# Patient Record
Sex: Female | Born: 1985 | Race: White | Hispanic: Yes | Marital: Married | Smoking: Never smoker
Health system: Southern US, Community
[De-identification: ages and names within clinical notes are randomized; demographics above are authoritative.]

## PROBLEM LIST (undated history)

## (undated) DIAGNOSIS — K219 Gastro-esophageal reflux disease without esophagitis: Secondary | ICD-10-CM

---
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
## 2017-06-17 ENCOUNTER — Ambulatory Visit: Payer: BC Managed Care – PPO | Admitting: Obstetrics and Gynecology

## 2017-06-17 VITALS — Ht 64.0 in | Wt 281.9 lb

## 2017-06-17 DIAGNOSIS — Z3A09 9 weeks gestation of pregnancy: Secondary | ICD-10-CM

## 2017-06-17 NOTE — Progress Notes (Signed)
Diana CollinsMaria N Mahoney presents for NOB nurse interview visit. [facility].  G- 2.  P-1 . Pregnancy education material explained and given. __No_ cats in the home. NOB labs ordered. (TSH/HbgA1c due to Increased BMI), . HIV labs and Drug screen were explained optional and  Drug screen ordered. PNV encouraged. Genetic screening options discussed. Genetic testing: unsure will discuss with provider.  Pt. To follow up with provider in _3_ weeks for NOB physical.  All questions answered.

## 2017-06-17 NOTE — Patient Instructions (Signed)
First Trimester of Pregnancy The first trimester of pregnancy is from week 1 until the end of week 13 (months 1 through 3). During this time, your baby will begin to develop inside you. At 6-8 weeks, the eyes and face are formed, and the heartbeat can be seen on ultrasound. At the end of 12 weeks, all the baby's organs are formed. Prenatal care is all the medical care you receive before the birth of your baby. Make sure you get good prenatal care and follow all of your doctor's instructions. Follow these instructions at home: Medicines  Take over-the-counter and prescription medicines only as told by your doctor. Some medicines are safe and some medicines are not safe during pregnancy.  Take a prenatal vitamin that contains at least 600 micrograms (mcg) of folic acid.  If you have trouble pooping (constipation), take medicine that will make your stool soft (stool softener) if your doctor approves. Eating and drinking  Eat regular, healthy meals.  Your doctor will tell you the amount of weight gain that is right for you.  Avoid raw meat and uncooked cheese.  If you feel sick to your stomach (nauseous) or throw up (vomit): ? Eat 4 or 5 small meals a day instead of 3 large meals. ? Try eating a few soda crackers. ? Drink liquids between meals instead of during meals.  To prevent constipation: ? Eat foods that are high in fiber, like fresh fruits and vegetables, whole grains, and beans. ? Drink enough fluids to keep your pee (urine) clear or pale yellow. Activity  Exercise only as told by your doctor. Stop exercising if you have cramps or pain in your lower belly (abdomen) or low back.  Do not exercise if it is too hot, too humid, or if you are in a place of great height (high altitude).  Try to avoid standing for long periods of time. Move your legs often if you must stand in one place for a long time.  Avoid heavy lifting.  Wear low-heeled shoes. Sit and stand up straight.  You  can have sex unless your doctor tells you not to. Relieving pain and discomfort  Wear a good support bra if your breasts are sore.  Take warm water baths (sitz baths) to soothe pain or discomfort caused by hemorrhoids. Use hemorrhoid cream if your doctor says it is okay.  Rest with your legs raised if you have leg cramps or low back pain.  If you have puffy, bulging veins (varicose veins) in your legs: ? Wear support hose or compression stockings as told by your doctor. ? Raise (elevate) your feet for 15 minutes, 3-4 times a day. ? Limit salt in your food. Prenatal care  Schedule your prenatal visits by the twelfth week of pregnancy.  Write down your questions. Take them to your prenatal visits.  Keep all your prenatal visits as told by your doctor. This is important. Safety  Wear your seat belt at all times when driving.  Make a list of emergency phone numbers. The list should include numbers for family, friends, the hospital, and police and fire departments. General instructions  Ask your doctor for a referral to a local prenatal class. Begin classes no later than at the start of month 6 of your pregnancy.  Ask for help if you need counseling or if you need help with nutrition. Your doctor can give you advice or tell you where to go for help.  Do not use hot tubs, steam rooms, or   saunas.  Do not douche or use tampons or scented sanitary pads.  Do not cross your legs for long periods of time.  Avoid all herbs and alcohol. Avoid drugs that are not approved by your doctor.  Do not use any tobacco products, including cigarettes, chewing tobacco, and electronic cigarettes. If you need help quitting, ask your doctor. You may get counseling or other support to help you quit.  Avoid cat litter boxes and soil used by cats. These carry germs that can cause birth defects in the baby and can cause a loss of your baby (miscarriage) or stillbirth.  Visit your dentist. At home, brush  your teeth with a soft toothbrush. Be gentle when you floss. Contact a doctor if:  You are dizzy.  You have mild cramps or pressure in your lower belly.  You have a nagging pain in your belly area.  You continue to feel sick to your stomach, you throw up, or you have watery poop (diarrhea).  You have a bad smelling fluid coming from your vagina.  You have pain when you pee (urinate).  You have increased puffiness (swelling) in your face, hands, legs, or ankles. Get help right away if:  You have a fever.  You are leaking fluid from your vagina.  You have spotting or bleeding from your vagina.  You have very bad belly cramping or pain.  You gain or lose weight rapidly.  You throw up blood. It may look like coffee grounds.  You are around people who have German measles, fifth disease, or chickenpox.  You have a very bad headache.  You have shortness of breath.  You have any kind of trauma, such as from a fall or a car accident. Summary  The first trimester of pregnancy is from week 1 until the end of week 13 (months 1 through 3).  To take care of yourself and your unborn baby, you will need to eat healthy meals, take medicines only if your doctor tells you to do so, and do activities that are safe for you and your baby.  Keep all follow-up visits as told by your doctor. This is important as your doctor will have to ensure that your baby is healthy and growing well. This information is not intended to replace advice given to you by your health care provider. Make sure you discuss any questions you have with your health care provider. Document Released: 01/06/2008 Document Revised: 07/28/2016 Document Reviewed: 07/28/2016 Elsevier Interactive Patient Education  2017 [employer]  

## 2017-06-18 LAB — ABO AND RH: Rh Factor: POSITIVE

## 2017-06-18 LAB — MONITOR DRUG PROFILE 14(MW)
Amphetamine Scrn, Ur: NEGATIVE ng/mL
BARBITURATE SCREEN URINE: NEGATIVE ng/mL
BENZODIAZEPINE SCREEN, URINE: NEGATIVE ng/mL
BUPRENORPHINE, URINE: NEGATIVE ng/mL
CANNABINOIDS UR QL SCN: NEGATIVE ng/mL
CREATININE(CRT), U: 197.6 mg/dL (ref 20.0–300.0)
Cocaine (Metab) Scrn, Ur: NEGATIVE ng/mL
Fentanyl, Urine: NEGATIVE pg/mL
MEPERIDINE SCREEN, URINE: NEGATIVE ng/mL
METHADONE SCREEN, URINE: NEGATIVE ng/mL
OXYCODONE+OXYMORPHONE UR QL SCN: NEGATIVE ng/mL
Opiate Scrn, Ur: NEGATIVE ng/mL
PH UR, DRUG SCRN: 5.7 (ref 4.5–8.9)
PHENCYCLIDINE QUANTITATIVE URINE: NEGATIVE ng/mL
Propoxyphene Scrn, Ur: NEGATIVE ng/mL
SPECIFIC GRAVITY: 1.022
Tramadol Screen, Urine: NEGATIVE ng/mL

## 2017-06-18 LAB — CBC WITH DIFFERENTIAL/PLATELET
BASOS: 0 %
Basophils Absolute: 0 10*3/uL (ref 0.0–0.2)
EOS (ABSOLUTE): 0.3 10*3/uL (ref 0.0–0.4)
EOS: 3 %
HEMATOCRIT: 36.9 % (ref 34.0–46.6)
Hemoglobin: 12 g/dL (ref 11.1–15.9)
IMMATURE GRANS (ABS): 0 10*3/uL (ref 0.0–0.1)
IMMATURE GRANULOCYTES: 0 %
LYMPHS: 30 %
Lymphocytes Absolute: 3.1 10*3/uL (ref 0.7–3.1)
MCH: 28.6 pg (ref 26.6–33.0)
MCHC: 32.5 g/dL (ref 31.5–35.7)
MCV: 88 fL (ref 79–97)
MONOS ABS: 0.6 10*3/uL (ref 0.1–0.9)
Monocytes: 6 %
Neutrophils Absolute: 6.5 10*3/uL (ref 1.4–7.0)
Neutrophils: 61 %
PLATELETS: 276 10*3/uL (ref 150–379)
RBC: 4.19 x10E6/uL (ref 3.77–5.28)
RDW: 13.8 % (ref 12.3–15.4)
WBC: 10.5 10*3/uL (ref 3.4–10.8)

## 2017-06-18 LAB — HIV ANTIBODY (ROUTINE TESTING W REFLEX): HIV SCREEN 4TH GENERATION: NONREACTIVE

## 2017-06-18 LAB — MICROSCOPIC EXAMINATION: CASTS: NONE SEEN /LPF

## 2017-06-18 LAB — HEMOGLOBIN A1C
Est. average glucose Bld gHb Est-mCnc: 111 mg/dL
HEMOGLOBIN A1C: 5.5 % (ref 4.8–5.6)

## 2017-06-18 LAB — URINALYSIS, ROUTINE W REFLEX MICROSCOPIC
Bilirubin, UA: NEGATIVE
Glucose, UA: NEGATIVE
Ketones, UA: NEGATIVE
NITRITE UA: NEGATIVE
RBC, UA: NEGATIVE
Specific Gravity, UA: 1.029 (ref 1.005–1.030)
Urobilinogen, Ur: 0.2 mg/dL (ref 0.2–1.0)
pH, UA: 6 (ref 5.0–7.5)

## 2017-06-18 LAB — GC/CHLAMYDIA PROBE AMP
Chlamydia trachomatis, NAA: NEGATIVE
NEISSERIA GONORRHOEAE BY PCR: NEGATIVE

## 2017-06-18 LAB — RUBELLA SCREEN: RUBELLA: 2.2 {index} (ref >0.99)

## 2017-06-18 LAB — HEPATITIS B SURFACE ANTIGEN: HEP B S AG: NEGATIVE

## 2017-06-18 LAB — ANTIBODY SCREEN: Antibody Screen: NEGATIVE

## 2017-06-18 LAB — VARICELLA ZOSTER ANTIBODY, IGG: Varicella zoster IgG: 802 index (ref >165)

## 2017-06-18 LAB — RPR: RPR: NONREACTIVE

## 2017-06-18 LAB — TSH: TSH: 2.1 u[IU]/mL (ref 0.450–4.500)

## 2017-06-19 LAB — URINE CULTURE

## 2017-07-09 ENCOUNTER — Ambulatory Visit (INDEPENDENT_AMBULATORY_CARE_PROVIDER_SITE_OTHER): Payer: BC Managed Care – PPO | Admitting: Certified Nurse Midwife

## 2017-07-09 ENCOUNTER — Encounter: Payer: Self-pay | Admitting: Certified Nurse Midwife

## 2017-07-09 VITALS — BP 110/75 | HR 81 | Wt 282.0 lb

## 2017-07-09 DIAGNOSIS — Z6841 Body Mass Index (BMI) 40.0 and over, adult: Secondary | ICD-10-CM

## 2017-07-09 DIAGNOSIS — Z3482 Encounter for supervision of other normal pregnancy, second trimester: Secondary | ICD-10-CM | POA: Diagnosis not present

## 2017-07-09 LAB — POCT URINALYSIS DIPSTICK
Bilirubin, UA: NEGATIVE
Glucose, UA: NEGATIVE
Ketones, UA: NEGATIVE
NITRITE UA: NEGATIVE
PH UA: 6 (ref 5.0–8.0)
RBC UA: NEGATIVE
Spec Grav, UA: >=1.03 — AB (ref 1.010–1.025)
UROBILINOGEN UA: 0.2 U/dL

## 2017-07-09 NOTE — Progress Notes (Signed)
NOB PE- last pap 06/2016 neg.

## 2017-07-09 NOTE — Progress Notes (Signed)
NEW OB HISTORY AND PHYSICAL  SUBJECTIVE:       Diana Mahoney is a 31 y.o. G2P1 female, Patient's last menstrual period was 04/11/2017., Estimated Date of Delivery: 01/16/18, [id number], presents today for establishment of Prenatal Care.  She has no unusual complaints. Endorses nausea without vomiting, scent aversion, and breast tenderness.   Denies difficulty breathing or respiratory distress, chest pain, abdominal pain, vaginal bleeding, dysuria, and leg pain or swelling.   Gynecologic History  Patient's last menstrual period was 04/11/2017.   Contraception: none  Last Pap: 06/08/2016. Results were: normal  Obstetric History  OB History  Gravida Para Term Preterm AB Living  2 1          SAB TAB Ectopic Multiple Live Births               # Outcome Date GA Lbr Len/2nd Weight Sex Delivery Anes PTL Lv  2 Current           1 Para 2007    M CS-Unspec         History reviewed. No pertinent past medical history.  Past Surgical History:  Procedure Laterality Date  . CESAREAN SECTION      Current Outpatient Medications on File Prior to Visit  Medication Sig Dispense Refill  . Prenatal Vit-Fe Fumarate-FA (PRENATAL MULTIVITAMIN) TABS tablet Take 1 tablet daily at 12 noon by mouth.     No current facility-administered medications on file prior to visit.     No Known Allergies  Social History   Socioeconomic History  . Marital status: Married    Spouse name: Not on file  . Number of children: Not on file  . Years of education: Not on file  . Highest education level: Not on file  Social Needs  . Financial resource strain: Not on file  . Food insecurity - worry: Not on file  . Food insecurity - inability: Not on file  . Transportation needs - medical: Not on file  . Transportation needs - non-medical: Not on file  Occupational History  . Not on file  Tobacco Use  . Smoking status: Never Smoker  . Smokeless tobacco: Never Used  Substance and Sexual Activity  . Alcohol  use: No    Frequency: Never  . Drug use: No  . Sexual activity: Yes    Partners: Male  Other Topics Concern  . Not on file  Social History Narrative  . Not on file    History reviewed. No pertinent family history.  The following portions of the patient's history were reviewed and updated as appropriate: allergies, current medications, past OB history, past medical history, past surgical history, past family history, past social history, and problem list.  OBJECTIVE:  BP 110/75   Pulse 81   Wt 282 lb (127.9 kg)   LMP 04/11/2017   BMI 48.41 kg/m   Initial Physical Exam (New OB)  GENERAL APPEARANCE: alert, well appearing, in no apparent distress  HEAD: normocephalic, atraumatic  MOUTH: mucous membranes moist, pharynx normal without lesions and dental hygiene good  THYROID: no thyromegaly or masses present  BREASTS: no masses noted, no significant tenderness, no palpable axillary nodes, no skin changes  LUNGS: clear to auscultation, no wheezes, rales or rhonchi, symmetric air entry  HEART: regular rate and rhythm, no murmurs  ABDOMEN: soft, nontender, nondistended, no abnormal masses, no epigastric pain, obese, fundus not palpable and FHT present  EXTREMITIES: no redness or tenderness in the calves or thighs, no edema  SKIN: normal coloration and turgor, no rashes  LYMPH NODES: no adenopathy palpable  NEUROLOGIC: alert, oriented, normal speech, no focal findings or movement disorder noted  PELVIC EXAM: not indicated  ASSESSMENT: Normal pregnancy Previous cesarean section, desires repeat Declines genetic screening BMI>45  PLAN: Prenatal care New OB counseling: The patient has been given an overview regarding routine prenatal care. Recommendations regarding diet, weight gain, and exercise in pregnancy were given. Prenatal testing, optional genetic testing, and ultrasound use in pregnancy were reviewed.  Benefits of Breast Feeding were discussed. The patient  is encouraged to consider nursing her baby post partum. See orders   Gunnar BullaJenkins Michelle Vergene Marland, CNM [facility], [id number]

## 2017-07-09 NOTE — Patient Instructions (Signed)
Eating Plan for Pregnant Women While you are pregnant, your body will require additional nutrition to help support your growing baby. It is recommended that you consume:  150 additional calories each day during your first trimester.  300 additional calories each day during your second trimester.  300 additional calories each day during your third trimester.  Eating a healthy, well-balanced diet is very important for your health and for your baby's health. You also have a higher need for some vitamins and minerals, such as folic acid, calcium, iron, and vitamin D. What do I need to know about eating during pregnancy?  Do not try to lose weight or go on a diet during pregnancy.  Choose healthy, nutritious foods. Choose  of a sandwich with a glass of milk instead of a candy bar or a high-calorie sugar-sweetened beverage.  Limit your overall intake of foods that have "empty calories." These are foods that have little nutritional value, such as sweets, desserts, candies, sugar-sweetened beverages, and fried foods.  Eat a variety of foods, especially fruits and vegetables.  Take a prenatal vitamin to help meet the additional needs during pregnancy, specifically for folic acid, iron, calcium, and vitamin D.  Remember to stay active. Ask your health care provider for exercise recommendations that are specific to you.  Practice good food safety and cleanliness, such as washing your hands before you eat and after you prepare raw meat. This helps to prevent foodborne illnesses, such as listeriosis, that can be very dangerous for your baby. Ask your health care provider for more information about listeriosis. What does 150 extra calories look like? Healthy options for an additional 150 calories each day could be any of the following:  Plain low-fat yogurt (6-8 oz) with  cup of berries.  1 apple with 2 teaspoons of peanut butter.  Cut-up vegetables with  cup of hummus.  Low-fat chocolate milk  (8 oz or 1 cup).  1 string cheese with 1 medium orange.   of a peanut butter and jelly sandwich on whole-wheat bread (1 tsp of peanut butter).  For 300 calories, you could eat two of those healthy options each day. What is a healthy amount of weight to gain? The recommended amount of weight for you to gain is based on your pre-pregnancy BMI. If your pre-pregnancy BMI was:  Less than 18 (underweight), you should gain 28-40 lb.  18-24.9 (normal), you should gain 25-35 lb.  25-29.9 (overweight), you should gain 15-25 lb.  Greater than 30 (obese), you should gain 11-20 lb.  What if I am having twins or multiples? Generally, pregnant women who will be having twins or multiples may need to increase their daily calories by 300-600 calories each day. The recommended range for total weight gain is 25-54 lb, depending on your pre-pregnancy BMI. Talk with your health care provider for specific guidance about additional nutritional needs, weight gain, and exercise during your pregnancy. What foods can I eat? Grains Any grains. Try to choose whole grains, such as whole-wheat bread, oatmeal, or brown rice. Vegetables Any vegetables. Try to eat a variety of colors and types of vegetables to get a full range of vitamins and minerals. Remember to wash your vegetables well before eating. Fruits Any fruits. Try to eat a variety of colors and types of fruit to get a full range of vitamins and minerals. Remember to wash your fruits well before eating. Meats and Other Protein Sources Lean meats, including chicken, [place], fish, and lean cuts of beef, veal,  or pork. Make sure that all meats are cooked to "well done." Tofu. Tempeh. Beans. Eggs. Peanut butter and other nut butters. Seafood, such as shrimp, crab, and lobster. If you choose fish, select types that are higher in omega-3 fatty acids, including salmon, herring, mussels, trout, sardines, and pollock. Make sure that all meats are cooked to food-safe  temperatures. Dairy Pasteurized milk and milk alternatives. Pasteurized yogurt and pasteurized cheese. Cottage cheese. Sour cream. Beverages Water. Juices that contain 100% fruit juice or vegetable juice. Caffeine-free teas and decaffeinated coffee. Drinks that contain caffeine are okay to drink, but it is better to avoid caffeine. Keep your total caffeine intake to less than 200 mg each day (12 oz of coffee, tea, or soda) or as directed by your health care provider. Condiments Any pasteurized condiments. Sweets and Desserts Any sweets and desserts. Fats and Oils Any fats and oils. The items listed above may not be a complete list of recommended foods or beverages. Contact your dietitian for more options. What foods are not recommended? Vegetables Unpasteurized (raw) vegetable juices. Fruits Unpasteurized (raw) fruit juices. Meats and Other Protein Sources Cured meats that have nitrates, such as bacon, salami, and hotdogs. Luncheon meats, bologna, or other deli meats (unless they are reheated until they are steaming hot). Refrigerated pate, meat spreads from a meat counter, smoked seafood that is found in the refrigerated section of a store. Raw fish, such as sushi or sashimi. High mercury content fish, such as tilefish, shark, swordfish, and king mackerel. Raw meats, such as tuna or beef tartare. Undercooked meats and poultry. Make sure that all meats are cooked to food-safe temperatures. Dairy Unpasteurized (raw) milk and any foods that have raw milk in them. Soft cheeses, such as feta, queso blanco, queso fresco, Brie, Camembert cheeses, blue-veined cheeses, and Panela cheese (unless it is made with pasteurized milk, which must be stated on the label). Beverages Alcohol. Sugar-sweetened beverages, such as sodas, teas, or energy drinks. Condiments Homemade fermented foods and drinks, such as pickles, sauerkraut, or kombucha drinks. (Store-bought pasteurized versions of these are  okay.) Other Salads that are made in the store, such as ham salad, chicken salad, egg salad, tuna salad, and seafood salad. The items listed above may not be a complete list of foods and beverages to avoid. Contact your dietitian for more information. This information is not intended to replace advice given to you by your health care provider. Make sure you discuss any questions you have with your health care provider. Document Released: 05/04/2014 Document Revised: 12/26/2015 Document Reviewed: 01/02/2014 Elsevier Interactive Patient Education  2018 [employer]. Back Pain in Pregnancy Back pain during pregnancy is common. Back pain may be caused by several factors that are related to changes during your pregnancy. Follow these instructions at home: Managing pain, stiffness, and swelling  If directed, apply ice for sudden (acute) back pain. ? Put ice in a plastic bag. ? Place a towel between your skin and the bag. ? Leave the ice on for 20 minutes, 2-3 times per day.  If directed, apply heat to the affected area before you exercise: ? Place a towel between your skin and the heat pack or heating pad. ? Leave the heat on for 20-30 minutes. ? Remove the heat if your skin turns bright red. This is especially important if you are unable to feel pain, heat, or cold. You may have a greater risk of getting burned. Activity  Exercise as told by your health care provider. Exercising is  the best way to prevent or manage back pain.  Listen to your body when lifting. If lifting hurts, ask for help or bend your knees. This uses your leg muscles instead of your back muscles.  Squat down when picking up something from the floor. Do not bend over.  Only use bed rest as told by your health care provider. Bed rest should only be used for the most severe episodes of back pain. Standing, Sitting, and Lying Down  Do not stand in one place for long periods of time.  Use good posture when sitting. Make  sure your head rests over your shoulders and is not hanging forward. Use a pillow on your lower back if necessary.  Try sleeping on your side, preferably the left side, with a pillow or two between your legs. If you are sore after a night's rest, your bed may be too soft. A firm mattress may provide more support for your back during pregnancy. General instructions  Do not wear high heels.  Eat a healthy diet. Try to gain weight within your health care provider's recommendations.  Use a maternity girdle, elastic sling, or back brace as told by your health care provider.  Take over-the-counter and prescription medicines only as told by your health care provider.  Keep all follow-up visits as told by your health care provider. This is important. This includes any visits with any specialists, such as a physical therapist. Contact a health care provider if:  Your back pain interferes with your daily activities.  You have increasing pain in other parts of your body. Get help right away if:  You develop numbness, tingling, weakness, or problems with the use of your arms or legs.  You develop severe back pain that is not controlled with medicine.  You have a sudden change in bowel or bladder control.  You develop shortness of breath, dizziness, or you faint.  You develop nausea, vomiting, or sweating.  You have back pain that is a rhythmic, cramping pain similar to labor pains. Labor pain is usually 1-2 minutes apart, lasts for about 1 minute, and involves a bearing down feeling or pressure in your pelvis.  You have back pain and your water breaks or you have vaginal bleeding.  You have back pain or numbness that travels down your leg.  Your back pain developed after you fell.  You develop pain on one side of your back.  You see blood in your urine.  You develop skin blisters in the area of your back pain. This information is not intended to replace advice given to you by your  health care provider. Make sure you discuss any questions you have with your health care provider. Document Released: 10/28/2005 Document Revised: 12/26/2015 Document Reviewed: 04/03/2015 Elsevier Interactive Patient Education  2018 [employer]. Round Ligament Pain The round ligament is a cord of muscle and tissue that helps to support the uterus. It can become a source of pain during pregnancy if it becomes stretched or twisted as the baby grows. The pain usually begins in the second trimester of pregnancy, and it can come and go until the baby is delivered. It is not a serious problem, and it does not cause harm to the baby. Round ligament pain is usually a short, sharp, and pinching pain, but it can also be a dull, lingering, and aching pain. The pain is felt in the lower side of the abdomen or in the groin. It usually starts deep in the groin  and moves up to the outside of the hip area. Pain can occur with:  A sudden change in position.  Rolling over in bed.  Coughing or sneezing.  Physical activity.  Follow these instructions at home: Watch your condition for any changes. Take these steps to help with your pain:  When the pain starts, relax. Then try: ? Sitting down. ? Flexing your knees up to your abdomen. ? Lying on your side with one pillow under your abdomen and another pillow between your legs. ? Sitting in a warm bath for 15-20 minutes or until the pain goes away.  Take over-the-counter and prescription medicines only as told by your health care provider.  Move slowly when you sit and stand.  Avoid long walks if they cause pain.  Stop or lessen your physical activities if they cause pain.  Contact a health care provider if:  Your pain does not go away with treatment.  You feel pain in your back that you did not have before.  Your medicine is not helping. Get help right away if:  You develop a fever or chills.  You develop uterine contractions.  You develop  vaginal bleeding.  You develop nausea or vomiting.  You develop diarrhea.  You have pain when you urinate. This information is not intended to replace advice given to you by your health care provider. Make sure you discuss any questions you have with your health care provider. Document Released: 04/28/2008 Document Revised: 12/26/2015 Document Reviewed: 09/26/2014 Elsevier Interactive Patient Education  2018 [employer]. Common Medications Safe in Pregnancy  Acne:      Constipation:  Benzoyl Peroxide     Colace  Clindamycin      Dulcolax Suppository  Topica Erythromycin     Fibercon  Salicylic Acid      Metamucil         Miralax AVOID:        Senakot   Accutane    Cough:  Retin-A       Cough Drops  Tetracycline      Phenergan w/ Codeine if Rx  Minocycline      Robitussin (Plain & DM)  Antibiotics:     Crabs/Lice:  Ceclor       RID  Cephalosporins    AVOID:  E-Mycins      Kwell  Keflex  Macrobid/Macrodantin   Diarrhea:  Penicillin      Kao-Pectate  Zithromax      Imodium AD         PUSH FLUIDS AVOID:       Cipro     Fever:  Tetracycline      Tylenol (Regular or Extra  Minocycline       Strength)  Levaquin      Extra Strength-Do not          Exceed 8 tabs/24 hrs Caffeine:        <275m/day (equiv. To 1 cup of coffee or  approx. 3 12 oz sodas)         Gas: Cold/Hayfever:       Gas-X  Benadryl      Mylicon  Claritin       Phazyme  **Claritin-D        Chlor-Trimeton    Headaches:  Dimetapp      ASA-Free Excedrin  Drixoral-Non-Drowsy     Cold Compress  Mucinex (Guaifenasin)     Tylenol (Regular or Extra  Sudafed/Sudafed-12 Hour     Strength)  **Sudafed PE Pseudoephedrine  Tylenol Cold & Sinus     Vicks Vapor Rub  Zyrtec  **AVOID if Problems With Blood Pressure         Heartburn: Avoid lying down for at least 1 hour after meals  Aciphex      Maalox     Rash:  Milk of Magnesia     Benadryl    Mylanta       1% Hydrocortisone Cream  Pepcid  Pepcid  Complete   Sleep Aids:  Prevacid      Ambien   Prilosec       Benadryl  Rolaids       Chamomile Tea  Tums (Limit 4/day)     Unisom  Zantac       Tylenol PM         Warm milk-add vanilla or  Hemorrhoids:       Sugar for taste  Anusol/Anusol H.C.  (RX: Analapram 2.5%)  Sugar Substitutes:  Hydrocortisone OTC     Ok in moderation  Preparation H      Tucks        Vaseline lotion applied to tissue with wiping    Herpes:     Throat:  Acyclovir      Oragel  Famvir  Valtrex     Vaccines:         Flu Shot Leg Cramps:       *Gardasil  Benadryl      Hepatitis A         Hepatitis B Nasal Spray:       Pneumovax  Saline Nasal Spray     Polio Booster         Tetanus Nausea:       Tuberculosis test or PPD  Vitamin B6 25 mg TID   AVOID:    Dramamine      *Gardasil  Emetrol       Live Poliovirus  Ginger Root 250 mg QID    MMR (measles, mumps &  High Complex Carbs @ Bedtime    rebella)  Sea Bands-Accupressure    Varicella (Chickenpox)  Unisom 1/2 tab TID     *No known complications           If received before Pain:         Known pregnancy;   Darvocet       Resume series after  Lortab        Delivery  Percocet    Yeast:   Tramadol      Femstat  Tylenol 3      Gyne-lotrimin  Ultram       Monistat  Vicodin           MISC:         All Sunscreens           Hair Coloring/highlights          Insect Repellant's          (Including DEET)         Mystic Tans Second Trimester of Pregnancy The second trimester is from week 13 through week 28, month 4 through 6. This is often the time in pregnancy that you feel your best. Often times, morning sickness has lessened or quit. You may have more energy, and you may get hungry more often. Your unborn baby (fetus) is growing rapidly. At the end of the sixth month, he or she is about 9 inches long and weighs about 1 pounds. You will likely feel the  baby move (quickening) between 18 and 20 weeks of pregnancy. Follow these instructions at home:  Avoid  all smoking, herbs, and alcohol. Avoid drugs not approved by your doctor.  Do not use any tobacco products, including cigarettes, chewing tobacco, and electronic cigarettes. If you need help quitting, ask your doctor. You may get counseling or other support to help you quit.  Only take medicine as told by your doctor. Some medicines are safe and some are not during pregnancy.  Exercise only as told by your doctor. Stop exercising if you start having cramps.  Eat regular, healthy meals.  Wear a good support bra if your breasts are tender.  Do not use hot tubs, steam rooms, or saunas.  Wear your seat belt when driving.  Avoid raw meat, uncooked cheese, and liter boxes and soil used by cats.  Take your prenatal vitamins.  Take 1500-2000 milligrams of calcium daily starting at the 20th week of pregnancy until you deliver your baby.  Try taking medicine that helps you poop (stool softener) as needed, and if your doctor approves. Eat more fiber by eating fresh fruit, vegetables, and whole grains. Drink enough fluids to keep your pee (urine) clear or pale yellow.  Take warm water baths (sitz baths) to soothe pain or discomfort caused by hemorrhoids. Use hemorrhoid cream if your doctor approves.  If you have puffy, bulging veins (varicose veins), wear support hose. Raise (elevate) your feet for 15 minutes, 3-4 times a day. Limit salt in your diet.  Avoid heavy lifting, wear low heals, and sit up straight.  Rest with your legs raised if you have leg cramps or low back pain.  Visit your dentist if you have not gone during your pregnancy. Use a soft toothbrush to brush your teeth. Be gentle when you floss.  You can have sex (intercourse) unless your doctor tells you not to.  Go to your doctor visits. Get help if:  You feel dizzy.  You have mild cramps or pressure in your lower belly (abdomen).  You have a nagging pain in your belly area.  You continue to feel sick to your stomach  (nauseous), throw up (vomit), or have watery poop (diarrhea).  You have bad smelling fluid coming from your vagina.  You have pain with peeing (urination). Get help right away if:  You have a fever.  You are leaking fluid from your vagina.  You have spotting or bleeding from your vagina.  You have severe belly cramping or pain.  You lose or gain weight rapidly.  You have trouble catching your breath and have chest pain.  You notice sudden or extreme puffiness (swelling) of your face, hands, ankles, feet, or legs.  You have not felt the baby move in over an hour.  You have severe headaches that do not go away with medicine.  You have vision changes. This information is not intended to replace advice given to you by your health care provider. Make sure you discuss any questions you have with your health care provider. Document Released: 10/14/2009 Document Revised: 12/26/2015 Document Reviewed: 09/20/2012 Elsevier Interactive Patient Education  2017 [employer] WHAT OB PATIENTS CAN EXPECT   Confirmation of pregnancy and ultrasound ordered if medically indicated-[redacted] weeks gestation  New OB (NOB) intake with nurse and New OB (NOB) labs- [redacted] weeks gestation  New OB (NOB) physical examination with provider- 11/[redacted] weeks gestation  Flu vaccine-[redacted] weeks gestation  Anatomy scan-[redacted] weeks gestation  Glucose tolerance test, blood work to test for  anemia, T-dap vaccine-[redacted] weeks gestation  Vaginal swabs/cultures-STD/Group B strep-[redacted] weeks gestation  Appointments every 4 weeks until 28 weeks  Every 2 weeks from 28 weeks until 36 weeks  Weekly visits from 36 weeks until delivery

## 2017-08-03 NOTE — L&D Delivery Note (Addendum)
Delivery Summary for Jaynie CollinsMaria N Skillin  Labor Events:   Preterm labor:   Rupture date:   Rupture time:   Rupture type:   Fluid Color:   Induction:   Augmentation:   Complications:   Cervical ripening:          Delivery:   Episiotomy:   Lacerations:   Repair suture:   Repair # of packets:   Blood loss (ml): 1000   Information for the patient's newborn:  Kathyrn DrownGonzalez, Boy Idella [[id number]][[id number]]    Delivery 01/10/2018 8:18 AM by  C-Section, Low Vertical Sex:  female Gestational Age: 241w1d Delivery Clinician:   Living?:         APGARS  One minute Five minutes Ten minutes  Skin color:        Heart rate:        Grimace:        Muscle tone:        Breathing:        Totals: 8  8      Presentation/position:      Resuscitation:   Cord information:    Disposition of cord blood:     Blood gases sent?  Complications:   Placenta: Delivered:       appearance Newborn Measurements: Weight: 8 lb 7.1 oz (3830 g)  Height: 20.16"  Head circumference:    Chest circumference:    Other providers:    Additional  information: Forceps:   Vacuum:   Breech:   Observed anomalies       See Dr. Oretha Milchherry's operative note for details of procedure.   Hildred Laserherry, Jhoselyn Ruffini, MD [facility]

## 2017-08-06 ENCOUNTER — Encounter: Payer: Self-pay | Admitting: Certified Nurse Midwife

## 2017-08-06 ENCOUNTER — Other Ambulatory Visit: Payer: BC Managed Care – PPO

## 2017-08-06 ENCOUNTER — Ambulatory Visit (INDEPENDENT_AMBULATORY_CARE_PROVIDER_SITE_OTHER): Payer: BC Managed Care – PPO | Admitting: Certified Nurse Midwife

## 2017-08-06 VITALS — BP 114/87 | HR 86 | Wt 279.3 lb

## 2017-08-06 DIAGNOSIS — Z3482 Encounter for supervision of other normal pregnancy, second trimester: Secondary | ICD-10-CM

## 2017-08-06 DIAGNOSIS — Z3689 Encounter for other specified antenatal screening: Secondary | ICD-10-CM

## 2017-08-06 LAB — POCT URINALYSIS DIPSTICK
Bilirubin, UA: NEGATIVE
Blood, UA: NEGATIVE
Glucose, UA: NEGATIVE
KETONES UA: NEGATIVE
NITRITE UA: NEGATIVE
ODOR: NEGATIVE
PH UA: 8 (ref 5.0–8.0)
PROTEIN UA: NEGATIVE
Spec Grav, UA: 1.01 (ref 1.010–1.025)
UROBILINOGEN UA: 0.2 U/dL

## 2017-08-06 NOTE — Patient Instructions (Signed)
Common Medications Safe in Pregnancy  Acne:      Constipation:  Benzoyl Peroxide     Colace  Clindamycin      Dulcolax Suppository  Topica Erythromycin     Fibercon  Salicylic Acid      Metamucil         Miralax AVOID:        Senakot   Accutane    Cough:  Retin-A       Cough Drops  Tetracycline      Phenergan w/ Codeine if Rx  Minocycline      Robitussin (Plain & DM)  Antibiotics:     Crabs/Lice:  Ceclor       RID  Cephalosporins    AVOID:  E-Mycins      Kwell  Keflex  Macrobid/Macrodantin   Diarrhea:  Penicillin      Kao-Pectate  Zithromax      Imodium AD         PUSH FLUIDS AVOID:       Cipro     Fever:  Tetracycline      Tylenol (Regular or Extra  Minocycline       Strength)  Levaquin      Extra Strength-Do not          Exceed 8 tabs/24 hrs Caffeine:        <200mg/day (equiv. To 1 cup of coffee or  approx. 3 12 oz sodas)         Gas: Cold/Hayfever:       Gas-X  Benadryl      Mylicon  Claritin       Phazyme  **Claritin-D        Chlor-Trimeton    Headaches:  Dimetapp      ASA-Free Excedrin  Drixoral-Non-Drowsy     Cold Compress  Mucinex (Guaifenasin)     Tylenol (Regular or Extra  Sudafed/Sudafed-12 Hour     Strength)  **Sudafed PE Pseudoephedrine   Tylenol Cold & Sinus     Vicks Vapor Rub  Zyrtec  **AVOID if Problems With Blood Pressure         Heartburn: Avoid lying down for at least 1 hour after meals  Aciphex      Maalox     Rash:  Milk of Magnesia     Benadryl    Mylanta       1% Hydrocortisone Cream  Pepcid  Pepcid Complete   Sleep Aids:  Prevacid      Ambien   Prilosec       Benadryl  Rolaids       Chamomile Tea  Tums (Limit 4/day)     Unisom  Zantac       Tylenol PM         Warm milk-add vanilla or  Hemorrhoids:       Sugar for taste  Anusol/Anusol H.C.  (RX: Analapram 2.5%)  Sugar Substitutes:  Hydrocortisone OTC     Ok in moderation  Preparation H      Tucks        Vaseline lotion applied to tissue with  wiping    Herpes:     Throat:  Acyclovir      Oragel  Famvir  Valtrex     Vaccines:         Flu Shot Leg Cramps:       *Gardasil  Benadryl      Hepatitis A         Hepatitis B Nasal Spray:         Pneumovax  Saline Nasal Spray     Polio Booster         Tetanus Nausea:       Tuberculosis test or PPD  Vitamin B6 25 mg TID   AVOID:    Dramamine      *Gardasil  Emetrol       Live Poliovirus  Ginger Root 250 mg QID    MMR (measles, mumps &  High Complex Carbs @ Bedtime    rebella)  Sea Bands-Accupressure    Varicella (Chickenpox)  Unisom 1/2 tab TID     *No known complications           If received before Pain:         Known pregnancy;   Darvocet       Resume series after  Lortab        Delivery  Percocet    Yeast:   Tramadol      Femstat  Tylenol 3      Gyne-lotrimin  Ultram       Monistat  Vicodin           MISC:         All Sunscreens           Hair Coloring/highlights          Insect Repellant's          (Including DEET)         Mystic Tans Abdominal Pain During Pregnancy Belly (abdominal) pain is common during pregnancy. Most of the time, it is not a serious problem. Other times, it can be a sign that something is wrong with the pregnancy. Always tell your doctor if you have belly pain. Follow these instructions at home: Monitor your belly pain for any changes. The following actions may help you feel better:  Do not have sex (intercourse) or put anything in your vagina until you feel better.  Rest until your pain stops.  Drink clear fluids if you feel sick to your stomach (nauseous). Do not eat solid food until you feel better.  Only take medicine as told by your doctor.  Keep all doctor visits as told.  Get help right away if:  You are bleeding, leaking fluid, or pieces of tissue come out of your vagina.  You have more pain or cramping.  You keep throwing up (vomiting).  You have pain when you pee (urinate) or have blood in your pee.  You have a  fever.  You do not feel your baby moving as much.  You feel very weak or feel like passing out.  You have trouble breathing, with or without belly pain.  You have a very bad headache and belly pain.  You have fluid leaking from your vagina and belly pain.  You keep having watery poop (diarrhea).  Your belly pain does not go away after resting, or the pain gets worse. This information is not intended to replace advice given to you by your health care provider. Make sure you discuss any questions you have with your health care provider. Document Released: 07/08/2009 Document Revised: 02/26/2016 Document Reviewed: 02/16/2013 Elsevier Interactive Patient Education  2018 [employer] Back Pain in Pregnancy Back pain during pregnancy is common. Back pain may be caused by several factors that are related to changes during your pregnancy. Follow these instructions at home: Managing pain, stiffness, and swelling  If directed, apply ice for sudden (acute) back pain. ? Put ice in a plastic bag. ? Place   a towel between your skin and the bag. ? Leave the ice on for 20 minutes, 2-3 times per day.  If directed, apply heat to the affected area before you exercise: ? Place a towel between your skin and the heat pack or heating pad. ? Leave the heat on for 20-30 minutes. ? Remove the heat if your skin turns bright red. This is especially important if you are unable to feel pain, heat, or cold. You may have a greater risk of getting burned. Activity  Exercise as told by your health care provider. Exercising is the best way to prevent or manage back pain.  Listen to your body when lifting. If lifting hurts, ask for help or bend your knees. This uses your leg muscles instead of your back muscles.  Squat down when picking up something from the floor. Do not bend over.  Only use bed rest as told by your health care provider. Bed rest should only be used for the most severe episodes of back  pain. Standing, Sitting, and Lying Down  Do not stand in one place for long periods of time.  Use good posture when sitting. Make sure your head rests over your shoulders and is not hanging forward. Use a pillow on your lower back if necessary.  Try sleeping on your side, preferably the left side, with a pillow or two between your legs. If you are sore after a night's rest, your bed may be too soft. A firm mattress may provide more support for your back during pregnancy. General instructions  Do not wear high heels.  Eat a healthy diet. Try to gain weight within your health care provider's recommendations.  Use a maternity girdle, elastic sling, or back brace as told by your health care provider.  Take over-the-counter and prescription medicines only as told by your health care provider.  Keep all follow-up visits as told by your health care provider. This is important. This includes any visits with any specialists, such as a physical therapist. Contact a health care provider if:  Your back pain interferes with your daily activities.  You have increasing pain in other parts of your body. Get help right away if:  You develop numbness, tingling, weakness, or problems with the use of your arms or legs.  You develop severe back pain that is not controlled with medicine.  You have a sudden change in bowel or bladder control.  You develop shortness of breath, dizziness, or you faint.  You develop nausea, vomiting, or sweating.  You have back pain that is a rhythmic, cramping pain similar to labor pains. Labor pain is usually 1-2 minutes apart, lasts for about 1 minute, and involves a bearing down feeling or pressure in your pelvis.  You have back pain and your water breaks or you have vaginal bleeding.  You have back pain or numbness that travels down your leg.  Your back pain developed after you fell.  You develop pain on one side of your back.  You see blood in your  urine.  You develop skin blisters in the area of your back pain. This information is not intended to replace advice given to you by your health care provider. Make sure you discuss any questions you have with your health care provider. Document Released: 10/28/2005 Document Revised: 12/26/2015 Document Reviewed: 04/03/2015 Elsevier Interactive Patient Education  2018 [employer]. Round Ligament Pain The round ligament is a cord of muscle and tissue that helps to support the uterus.  It can become a source of pain during pregnancy if it becomes stretched or twisted as the baby grows. The pain usually begins in the second trimester of pregnancy, and it can come and go until the baby is delivered. It is not a serious problem, and it does not cause harm to the baby. Round ligament pain is usually a short, sharp, and pinching pain, but it can also be a dull, lingering, and aching pain. The pain is felt in the lower side of the abdomen or in the groin. It usually starts deep in the groin and moves up to the outside of the hip area. Pain can occur with:  A sudden change in position.  Rolling over in bed.  Coughing or sneezing.  Physical activity.  Follow these instructions at home: Watch your condition for any changes. Take these steps to help with your pain:  When the pain starts, relax. Then try: ? Sitting down. ? Flexing your knees up to your abdomen. ? Lying on your side with one pillow under your abdomen and another pillow between your legs. ? Sitting in a warm bath for 15-20 minutes or until the pain goes away.  Take over-the-counter and prescription medicines only as told by your health care provider.  Move slowly when you sit and stand.  Avoid long walks if they cause pain.  Stop or lessen your physical activities if they cause pain.  Contact a health care provider if:  Your pain does not go away with treatment.  You feel pain in your back that you did not have  before.  Your medicine is not helping. Get help right away if:  You develop a fever or chills.  You develop uterine contractions.  You develop vaginal bleeding.  You develop nausea or vomiting.  You develop diarrhea.  You have pain when you urinate. This information is not intended to replace advice given to you by your health care provider. Make sure you discuss any questions you have with your health care provider. Document Released: 04/28/2008 Document Revised: 12/26/2015 Document Reviewed: 09/26/2014 Elsevier Interactive Patient Education  [employer].

## 2017-08-06 NOTE — Progress Notes (Signed)
ROB- breast tenderness and itching. Early gtt today. UTD on flu vac.

## 2017-08-06 NOTE — Progress Notes (Signed)
ROB-Reports intermittent breast tenderness and nipple itching. Discussed home treatment measures. Early glucola today. Letter given and faxed for anesthesia consult due to current BMI, Body mass index is 47.94 kg/m. Anticipatory guidance regarding anatomy scan and course of prenatal care. Reviewed red flag symptoms and when to call. RTC x 4 weeks for anatomy scan and ROB with Pattricia BossAnnie or sooner if needed.

## 2017-08-07 LAB — GLUCOSE, 1 HOUR GESTATIONAL: GESTATIONAL DIABETES SCREEN: 139 mg/dL (ref 65–139)

## 2017-08-16 ENCOUNTER — Encounter: Payer: Self-pay | Admitting: Certified Nurse Midwife

## 2017-08-23 ENCOUNTER — Encounter
Admission: RE | Admit: 2017-08-23 | Discharge: 2017-08-23 | Disposition: A | Discharge status: Home / Self Care | Payer: BC Managed Care – PPO | Source: Ambulatory Visit | Attending: Anesthesiology | Admitting: Anesthesiology

## 2017-08-23 NOTE — Consult Note (Signed)
[facility] Anesthesia Consultation  Jaynie CollinsMaria N Belmontes AVW:[id number] DOB: September 15, 1985 DOA: 08/23/2017 [place]CP: [place], [facility]   Requesting physician: [facility] Date of consultation: 08/23/17 Reason for consultation: obesity during pregnancy    HISTORY OF PRESENT ILLNESS: Ardeen JourdainMaria Buhrman  is a 32 y.o. female with a known history of obesity and hx of 1 prior cesarean delivery. She denies any comorbid medical conditions, no hx of asthma, heart disease, DM or HTN. Her prior delivery was via c-section done under spinal anesthesia. At this time she has not yet decided on TOLAC vs c-section for delivery.  PAST MEDICAL HISTORY:  No past medical history on file.  PAST SURGICAL HISTORY:  Past Surgical History:  Procedure Laterality Date  . CESAREAN SECTION      SOCIAL HISTORY:  Social History   Tobacco Use  . Smoking status: Never Smoker  . Smokeless tobacco: Never Used  Substance Use Topics  . Alcohol use: No    Frequency: Never    FAMILY HISTORY: No family history on file.  DRUG ALLERGIES: No Known Allergies  REVIEW OF SYSTEMS:   CONSTITUTIONAL: No fever, fatigue or weakness.  RESPIRATORY: No cough, shortness of breath, wheezing  CARDIOVASCULAR: No chest pain, orthopnea, edema.  HEMATOLOGY: No anemia, easy bruising or bleeding SKIN: No rash or lesion.  MEDICATIONS AT HOME:  Prior to Admission medications   Medication Sig Start Date End Date Taking? Authorizing Provider  Prenatal Vit-Fe Fumarate-FA (PRENATAL MULTIVITAMIN) TABS tablet Take 1 tablet daily at 12 noon by mouth.    [provider]      PHYSICAL EXAMINATION:   VITAL SIGNS: Last menstrual period 04/11/2017.  GENERAL:  32 y.o.-year-old patient with no acute distress.  HEENT: Head atraumatic, normocephalic. Oropharynx and nasopharynx clear, MP 2 LUNGS: Normal breath sounds bilaterally, no wheezing, rales,rhonch. No use of accessory muscles of respiration.   CARDIOVASCULAR: S1, S2 normal. No murmurs, rubs, or gallops.  PSYCHIATRIC: The patient is alert and oriented x 3.  SKIN: No obvious rash, lesion, or ulcer.    IMPRESSION AND PLAN:  [age] yof with hx of cesarean delivery and obesity during pregnancy. BMI is currently 48 and EDD is 01/16/18. We discussed that weight gain over a BMI of 52 would require her to be transferred to a higher level of care. As she is yet undecided on MOD we only briefly discussed anesthetic options for either c-section or vaginal delivery.  Alver FisherAmy Jermel Artley M.D on 08/23/2017 at 1:28 PM

## 2017-09-03 ENCOUNTER — Encounter: Payer: BC Managed Care – PPO | Admitting: Certified Nurse Midwife

## 2017-09-03 ENCOUNTER — Other Ambulatory Visit: Payer: BC Managed Care – PPO

## 2017-09-06 ENCOUNTER — Encounter: Payer: Self-pay | Admitting: Certified Nurse Midwife

## 2017-09-06 ENCOUNTER — Ambulatory Visit (INDEPENDENT_AMBULATORY_CARE_PROVIDER_SITE_OTHER): Payer: BC Managed Care – PPO

## 2017-09-06 ENCOUNTER — Ambulatory Visit (INDEPENDENT_AMBULATORY_CARE_PROVIDER_SITE_OTHER): Payer: BC Managed Care – PPO | Admitting: Certified Nurse Midwife

## 2017-09-06 DIAGNOSIS — Z3482 Encounter for supervision of other normal pregnancy, second trimester: Secondary | ICD-10-CM | POA: Diagnosis not present

## 2017-09-06 DIAGNOSIS — Z3689 Encounter for other specified antenatal screening: Secondary | ICD-10-CM

## 2017-09-06 DIAGNOSIS — Z3A21 21 weeks gestation of pregnancy: Secondary | ICD-10-CM

## 2017-09-06 LAB — POCT URINALYSIS DIPSTICK
Glucose, UA: NEGATIVE
KETONES UA: NEGATIVE
Nitrite, UA: NEGATIVE
PROTEIN UA: NEGATIVE
RBC UA: NEGATIVE
SPEC GRAV UA: 1.01 (ref 1.010–1.025)
Urobilinogen, UA: 0.2 E.U./dL
pH, UA: 8 (ref 5.0–8.0)

## 2017-09-06 NOTE — Progress Notes (Signed)
PT states that she has a cough and when she coughs it hurts in the pelvic area. PT is doing well.

## 2017-09-06 NOTE — Patient Instructions (Signed)

## 2017-09-06 NOTE — Progress Notes (Signed)
Diana Mahoney,  Doing well. Anatomy u/s today normal, see below. Fundal height difficult to measure due to maternal body habitus. She has a cough - I referred her to medication list.  Discussed current BMI and possibility that she will need to be delivered at another facility if BMI is greater than 50. Encouraged her to watch her diet and exercise . She verbalizes understanding and agrees to plan.   Body mass index is 48.61 kg/m.  She has already had an Anesthesia consult , will plan to resend if/when BMI reaches 50. Pt verbalizes understanding and agrees to plan.  Follow up 4 wks.   Doreene BurkeAnnie Ronetta Molla, CNM   ULTRASOUND REPORT  Location: [facility] Date of Service:  09/06/2017  Indications: Anatomy Findings:  Mason JimSingleton intrauterine pregnancy is visualized with FHR at 131 BPM. Biometrics give an (U/S) Gestational age of 30 3/7 weeks and an (U/S) EDD of 01/14/18; this correlates with the clinically established EDD of 01/16/18.  Fetal presentation is breech.  EFW: 432 grams (0lb 15oz). Placenta: Anterior and grade 1.  Placenta is 5.6 cm from cervical os. AFI: WNL subjectively.  Anatomic survey is complete and appears WNL, however, it is a very limited scan due to maternal body habitus. Gender - Female.   Right Ovary measures 2.0 x 1.7 x 1.5 cm. It is normal in appearance. Left Ovary was not visualized due to overlying bowel gas. There is no obvious evidence of a corpus luteal cyst. Survey of the adnexa demonstrates no adnexal masses. There is no free peritoneal fluid in the cul de sac.  Impression: 1. 21 3/7 week Viable Singleton Intrauterine pregnancy by U/S. 2. (U/S) EDD is consistent with Clinically established (LMP) EDD of 01/16/18. 3. Normal Appearing Anatomy Scan  4. ** Very limited due to maternal body habitus **  Recommendations: 1.Clinical correlation with the patient's History and Physical Exam.   Kari BaarsJill Long, RDMS

## 2017-09-06 NOTE — Addendum Note (Signed)
Addended by: Silvano BilisHAMPTON, Hager Compston L on: 09/06/2017 09:32 AM   Modules accepted: Orders

## 2017-10-08 ENCOUNTER — Encounter: Payer: Self-pay | Admitting: Certified Nurse Midwife

## 2017-10-08 ENCOUNTER — Ambulatory Visit (INDEPENDENT_AMBULATORY_CARE_PROVIDER_SITE_OTHER): Payer: BC Managed Care – PPO | Admitting: Certified Nurse Midwife

## 2017-10-08 VITALS — BP 117/76 | HR 99 | Wt 280.6 lb

## 2017-10-08 DIAGNOSIS — Z130 Encounter for screening for diseases of the blood and blood-forming organs and certain disorders involving the immune mechanism: Secondary | ICD-10-CM

## 2017-10-08 DIAGNOSIS — O9989 Other specified diseases and conditions complicating pregnancy, childbirth and the puerperium: Secondary | ICD-10-CM

## 2017-10-08 DIAGNOSIS — O99619 Diseases of the digestive system complicating pregnancy, unspecified trimester: Secondary | ICD-10-CM

## 2017-10-08 DIAGNOSIS — Z131 Encounter for screening for diabetes mellitus: Secondary | ICD-10-CM

## 2017-10-08 DIAGNOSIS — O99210 Obesity complicating pregnancy, unspecified trimester: Secondary | ICD-10-CM

## 2017-10-08 DIAGNOSIS — R252 Cramp and spasm: Secondary | ICD-10-CM

## 2017-10-08 DIAGNOSIS — K219 Gastro-esophageal reflux disease without esophagitis: Secondary | ICD-10-CM

## 2017-10-08 DIAGNOSIS — Z3482 Encounter for supervision of other normal pregnancy, second trimester: Secondary | ICD-10-CM

## 2017-10-08 LAB — POCT URINALYSIS DIPSTICK
Bilirubin, UA: NEGATIVE
Blood, UA: NEGATIVE
GLUCOSE UA: NEGATIVE
Ketones, UA: NEGATIVE
NITRITE UA: NEGATIVE
ODOR: NEGATIVE
PROTEIN UA: NEGATIVE
SPEC GRAV UA: 1.01 (ref 1.010–1.025)
Urobilinogen, UA: 0.2 E.U./dL
pH, UA: 8 (ref 5.0–8.0)

## 2017-10-08 MED ORDER — MAGNESIUM 400 MG PO CAPS: 1.0000 | ORAL_CAPSULE | Freq: Two times a day (BID) | ORAL | 2 refills | Status: DC

## 2017-10-08 MED ORDER — RANITIDINE HCL 150 MG PO TABS: 150.0000 mg | ORAL_TABLET | Freq: Two times a day (BID) | ORAL | 2 refills | Status: DC

## 2017-10-08 NOTE — Progress Notes (Signed)
ROB-Reports increased reflux symptoms and intermittent leg cramps, not relieved by Tums. Discussed home treatment measures. Rx: Zantac and Magnesium, see orders. Class schedule given. Anticipatory guidance regarding course of prenatal care. Reviewed red flag symptoms and when to call. RTC x 3 weeks for 28 week labs, Growth US and ROB or sooner if needed.

## 2017-10-08 NOTE — Progress Notes (Signed)
Rob- No complaints.  

## 2017-10-08 NOTE — Patient Instructions (Addendum)
Abdominal Pain During Pregnancy Belly (abdominal) pain is common during pregnancy. Most of the time, it is not a serious problem. Other times, it can be a sign that something is wrong with the pregnancy. Always tell your doctor if you have belly pain. Follow these instructions at home: Monitor your belly pain for any changes. The following actions may help you feel better:  Do not have sex (intercourse) or put anything in your vagina until you feel better.  Rest until your pain stops.  Drink clear fluids if you feel sick to your stomach (nauseous). Do not eat solid food until you feel better.  Only take medicine as told by your doctor.  Keep all doctor visits as told.  Get help right away if:  You are bleeding, leaking fluid, or pieces of tissue come out of your vagina.  You have more pain or cramping.  You keep throwing up (vomiting).  You have pain when you pee (urinate) or have blood in your pee.  You have a fever.  You do not feel your baby moving as much.  You feel very weak or feel like passing out.  You have trouble breathing, with or without belly pain.  You have a very bad headache and belly pain.  You have fluid leaking from your vagina and belly pain.  You keep having watery poop (diarrhea).  Your belly pain does not go away after resting, or the pain gets worse. This information is not intended to replace advice given to you by your health care provider. Make sure you discuss any questions you have with your health care provider. Document Released: 07/08/2009 Document Revised: 02/26/2016 Document Reviewed: 02/16/2013 Elsevier Interactive Patient Education  2018 [employer]. Back Pain in Pregnancy Back pain during pregnancy is common. Back pain may be caused by several factors that are related to changes during your pregnancy. Follow these instructions at home: Managing pain, stiffness, and swelling  If directed, apply ice for sudden (acute) back  pain. ? Put ice in a plastic bag. ? Place a towel between your skin and the bag. ? Leave the ice on for 20 minutes, 2-3 times per day.  If directed, apply heat to the affected area before you exercise: ? Place a towel between your skin and the heat pack or heating pad. ? Leave the heat on for 20-30 minutes. ? Remove the heat if your skin turns bright red. This is especially important if you are unable to feel pain, heat, or cold. You may have a greater risk of getting burned. Activity  Exercise as told by your health care provider. Exercising is the best way to prevent or manage back pain.  Listen to your body when lifting. If lifting hurts, ask for help or bend your knees. This uses your leg muscles instead of your back muscles.  Squat down when picking up something from the floor. Do not bend over.  Only use bed rest as told by your health care provider. Bed rest should only be used for the most severe episodes of back pain. Standing, Sitting, and Lying Down  Do not stand in one place for long periods of time.  Use good posture when sitting. Make sure your head rests over your shoulders and is not hanging forward. Use a pillow on your lower back if necessary.  Try sleeping on your side, preferably the left side, with a pillow or two between your legs. If you are sore after a night's rest, your bed may  be too soft. A firm mattress may provide more support for your back during pregnancy. General instructions  Do not wear high heels.  Eat a healthy diet. Try to gain weight within your health care provider's recommendations.  Use a maternity girdle, elastic sling, or back brace as told by your health care provider.  Take over-the-counter and prescription medicines only as told by your health care provider.  Keep all follow-up visits as told by your health care provider. This is important. This includes any visits with any specialists, such as a physical therapist. Contact a health  care provider if:  Your back pain interferes with your daily activities.  You have increasing pain in other parts of your body. Get help right away if:  You develop numbness, tingling, weakness, or problems with the use of your arms or legs.  You develop severe back pain that is not controlled with medicine.  You have a sudden change in bowel or bladder control.  You develop shortness of breath, dizziness, or you faint.  You develop nausea, vomiting, or sweating.  You have back pain that is a rhythmic, cramping pain similar to labor pains. Labor pain is usually 1-2 minutes apart, lasts for about 1 minute, and involves a bearing down feeling or pressure in your pelvis.  You have back pain and your water breaks or you have vaginal bleeding.  You have back pain or numbness that travels down your leg.  Your back pain developed after you fell.  You develop pain on one side of your back.  You see blood in your urine.  You develop skin blisters in the area of your back pain. This information is not intended to replace advice given to you by your health care provider. Make sure you discuss any questions you have with your health care provider. Document Released: 10/28/2005 Document Revised: 12/26/2015 Document Reviewed: 04/03/2015 Elsevier Interactive Patient Education  2018 [employer]. Common Medications Safe in Pregnancy  Acne:      Constipation:  Benzoyl Peroxide     Colace  Clindamycin      Dulcolax Suppository  Topica Erythromycin     Fibercon  Salicylic Acid      Metamucil         Miralax AVOID:        Senakot   Accutane    Cough:  Retin-A       Cough Drops  Tetracycline      Phenergan w/ Codeine if Rx  Minocycline      Robitussin (Plain & DM)  Antibiotics:     Crabs/Lice:  Ceclor       RID  Cephalosporins    AVOID:  E-Mycins      Kwell  Keflex  Macrobid/Macrodantin   Diarrhea:  Penicillin      Kao-Pectate  Zithromax      Imodium AD         PUSH  FLUIDS AVOID:       Cipro     Fever:  Tetracycline      Tylenol (Regular or Extra  Minocycline       Strength)  Levaquin      Extra Strength-Do not          Exceed 8 tabs/24 hrs Caffeine:        <232m/day (equiv. To 1 cup of coffee or  approx. 3 12 oz sodas)         Gas: Cold/Hayfever:       Gas-X  Benadryl  Mylicon  Claritin       Phazyme  **Claritin-D        Chlor-Trimeton    Headaches:  Dimetapp      ASA-Free Excedrin  Drixoral-Non-Drowsy     Cold Compress  Mucinex (Guaifenasin)     Tylenol (Regular or Extra  Sudafed/Sudafed-12 Hour     Strength)  **Sudafed PE Pseudoephedrine   Tylenol Cold & Sinus     Vicks Vapor Rub  Zyrtec  **AVOID if Problems With Blood Pressure         Heartburn: Avoid lying down for at least 1 hour after meals  Aciphex      Maalox     Rash:  Milk of Magnesia     Benadryl    Mylanta       1% Hydrocortisone Cream  Pepcid  Pepcid Complete   Sleep Aids:  Prevacid      Ambien   Prilosec       Benadryl  Rolaids       Chamomile Tea  Tums (Limit 4/day)     Unisom  Zantac       Tylenol PM         Warm milk-add vanilla or  Hemorrhoids:       Sugar for taste  Anusol/Anusol H.C.  (RX: Analapram 2.5%)  Sugar Substitutes:  Hydrocortisone OTC     Ok in moderation  Preparation H      Tucks        Vaseline lotion applied to tissue with wiping    Herpes:     Throat:  Acyclovir      Oragel  Famvir  Valtrex     Vaccines:         Flu Shot Leg Cramps:       *Gardasil  Benadryl      Hepatitis A         Hepatitis B Nasal Spray:       Pneumovax  Saline Nasal Spray     Polio Booster         Tetanus Nausea:       Tuberculosis test or PPD  Vitamin B6 25 mg TID   AVOID:    Dramamine      *Gardasil  Emetrol       Live Poliovirus  Ginger Root 250 mg QID    MMR (measles, mumps &  High Complex Carbs @ Bedtime    rebella)  Sea Bands-Accupressure    Varicella (Chickenpox)  Unisom 1/2 tab TID     *No known complications           If received  before Pain:         Known pregnancy;   Darvocet       Resume series after  Lortab        Delivery  Percocet    Yeast:   Tramadol      Femstat  Tylenol 3      Gyne-lotrimin  Ultram       Monistat  Vicodin           MISC:         All Sunscreens           Hair Coloring/highlights          Insect Repellant's          (Including DEET)         Mystic Tans Third Trimester of Pregnancy The third trimester is from week 29 through week 42, months 7  through 9. This trimester is when your unborn baby (fetus) is growing very fast. At the end of the ninth month, the unborn baby is about 20 inches in length. It weighs about 6-10 pounds. Follow these instructions at home:  Avoid all smoking, herbs, and alcohol. Avoid drugs not approved by your doctor.  Do not use any tobacco products, including cigarettes, chewing tobacco, and electronic cigarettes. If you need help quitting, ask your doctor. You may get counseling or other support to help you quit.  Only take medicine as told by your doctor. Some medicines are safe and some are not during pregnancy.  Exercise only as told by your doctor. Stop exercising if you start having cramps.  Eat regular, healthy meals.  Wear a good support bra if your breasts are tender.  Do not use hot tubs, steam rooms, or saunas.  Wear your seat belt when driving.  Avoid raw meat, uncooked cheese, and liter boxes and soil used by cats.  Take your prenatal vitamins.  Take 1500-2000 milligrams of calcium daily starting at the 20th week of pregnancy until you deliver your baby.  Try taking medicine that helps you poop (stool softener) as needed, and if your doctor approves. Eat more fiber by eating fresh fruit, vegetables, and whole grains. Drink enough fluids to keep your pee (urine) clear or pale yellow.  Take warm water baths (sitz baths) to soothe pain or discomfort caused by hemorrhoids. Use hemorrhoid cream if your doctor approves.  If you have puffy,  bulging veins (varicose veins), wear support hose. Raise (elevate) your feet for 15 minutes, 3-4 times a day. Limit salt in your diet.  Avoid heavy lifting, wear low heels, and sit up straight.  Rest with your legs raised if you have leg cramps or low back pain.  Visit your dentist if you have not gone during your pregnancy. Use a soft toothbrush to brush your teeth. Be gentle when you floss.  You can have sex (intercourse) unless your doctor tells you not to.  Do not travel far distances unless you must. Only do so with your doctor's approval.  Take prenatal classes.  Practice driving to the hospital.  Pack your hospital bag.  Prepare the baby's room.  Go to your doctor visits. Get help if:  You are not sure if you are in labor or if your water has broken.  You are dizzy.  You have mild cramps or pressure in your lower belly (abdominal).  You have a nagging pain in your belly area.  You continue to feel sick to your stomach (nauseous), throw up (vomit), or have watery poop (diarrhea).  You have bad smelling fluid coming from your vagina.  You have pain with peeing (urination). Get help right away if:  You have a fever.  You are leaking fluid from your vagina.  You are spotting or bleeding from your vagina.  You have severe belly cramping or pain.  You lose or gain weight rapidly.  You have trouble catching your breath and have chest pain.  You notice sudden or extreme puffiness (swelling) of your face, hands, ankles, feet, or legs.  You have not felt the baby move in over an hour.  You have severe headaches that do not go away with medicine.  You have vision changes. This information is not intended to replace advice given to you by your health care provider. Make sure you discuss any questions you have with your health care provider. Document Released: 10/14/2009 Document  Revised: 12/26/2015 Document Reviewed: 09/20/2012 Elsevier Interactive Patient  Education  2017 [employer]  Laparoscopic Tubal Ligation, Care After Refer to this sheet in the next few weeks. These instructions provide you with information about caring for yourself after your procedure. Your health care provider may also give you more specific instructions. Your treatment has been planned according to current medical practices, but problems sometimes occur. Call your health care provider if you have any problems or questions after your procedure. What can I expect after the procedure? After the procedure, it is common to have:  A sore throat.  Discomfort in your shoulder.  Mild discomfort or cramping in your abdomen.  Gas pains.  Pain or soreness in the area where the surgical cut (incision) was made.  A bloated feeling.  Tiredness.  Nausea.  Vomiting.  Follow these instructions at home: Medicines  Take over-the-counter and prescription medicines only as told by your health care provider.  Do not take aspirin because it can cause bleeding.  Do not drive or operate heavy machinery while taking prescription pain medicine. Activity  Rest for the rest of the day.  Return to your normal activities as told by your health care provider. Ask your health care provider what activities are safe for you. Incision care   Follow instructions from your health care provider about how to take care of your incision. Make sure you: ? Wash your hands with soap and water before you change your bandage (dressing). If soap and water are not available, use hand sanitizer. ? Change your dressing as told by your health care provider. ? Leave stitches (sutures) in place. They may need to stay in place for 2 weeks or longer.  Check your incision area every day for signs of infection. Check for: ? More redness, swelling, or pain. ? More fluid or blood. ? Warmth. ? Pus or a bad smell. Other Instructions  Do not take baths, swim, or use a hot tub until your health care  provider approves. You may take showers.  Keep all follow-up visits as told by your health care provider. This is important.  Have someone help you with your daily household tasks for the first few days. Contact a health care provider if:  You have more redness, swelling, or pain around your incision.  Your incision feels warm to the touch.  You have pus or a bad smell coming from your incision.  The edges of your incision break open after the sutures have been removed.  Your pain does not improve after 2-3 days.  You have a rash.  You repeatedly become dizzy or light-headed.  Your pain medicine is not helping.  You are constipated. Get help right away if:  You have a fever.  You faint.  You have increasing pain in your abdomen.  You have severe pain in one or both of your shoulders.  You have fluid or blood coming from your sutures or from your vagina.  You have shortness of breath or difficulty breathing.  You have chest pain or leg pain.  You have ongoing nausea, vomiting, or diarrhea. This information is not intended to replace advice given to you by your health care provider. Make sure you discuss any questions you have with your health care provider. Document Released: 02/06/2005 Document Revised: 12/23/2015 Document Reviewed: 06/30/2015 Elsevier Interactive Patient Education  [employer].

## 2017-10-29 ENCOUNTER — Ambulatory Visit (INDEPENDENT_AMBULATORY_CARE_PROVIDER_SITE_OTHER): Payer: BC Managed Care – PPO | Admitting: Certified Nurse Midwife

## 2017-10-29 ENCOUNTER — Encounter: Payer: Self-pay | Admitting: Certified Nurse Midwife

## 2017-10-29 ENCOUNTER — Ambulatory Visit (INDEPENDENT_AMBULATORY_CARE_PROVIDER_SITE_OTHER): Payer: BC Managed Care – PPO

## 2017-10-29 ENCOUNTER — Other Ambulatory Visit: Payer: Self-pay

## 2017-10-29 ENCOUNTER — Ambulatory Visit: Payer: BC Managed Care – PPO

## 2017-10-29 VITALS — BP 113/67 | HR 92 | Wt 284.5 lb

## 2017-10-29 DIAGNOSIS — Z3482 Encounter for supervision of other normal pregnancy, second trimester: Secondary | ICD-10-CM

## 2017-10-29 DIAGNOSIS — Z23 Encounter for immunization: Secondary | ICD-10-CM

## 2017-10-29 DIAGNOSIS — O99210 Obesity complicating pregnancy, unspecified trimester: Secondary | ICD-10-CM

## 2017-10-29 DIAGNOSIS — Z130 Encounter for screening for diseases of the blood and blood-forming organs and certain disorders involving the immune mechanism: Secondary | ICD-10-CM

## 2017-10-29 DIAGNOSIS — Z3483 Encounter for supervision of other normal pregnancy, third trimester: Secondary | ICD-10-CM

## 2017-10-29 DIAGNOSIS — Z131 Encounter for screening for diabetes mellitus: Secondary | ICD-10-CM

## 2017-10-29 DIAGNOSIS — Z3493 Encounter for supervision of normal pregnancy, unspecified, third trimester: Secondary | ICD-10-CM

## 2017-10-29 LAB — POCT URINALYSIS DIPSTICK
Bilirubin, UA: NEGATIVE
Blood, UA: NEGATIVE
Glucose, UA: NEGATIVE
KETONES UA: NEGATIVE
NITRITE UA: NEGATIVE
Odor: NEGATIVE
PH UA: 7.5 (ref 5.0–8.0)
SPEC GRAV UA: 1.01 (ref 1.010–1.025)
UROBILINOGEN UA: 0.2 U/dL

## 2017-10-29 MED ORDER — TETANUS-DIPHTH-ACELL PERTUSSIS 5-2.5-18.5 LF-MCG/0.5 IM SUSP
0.5000 mL | Freq: Once | INTRAMUSCULAR | Status: AC
  Administered 2017-10-29: 0.5 mL via INTRAMUSCULAR

## 2017-10-29 NOTE — Patient Instructions (Addendum)
Breastfeeding Choosing to breastfeed is one of the best decisions you can make for yourself and your baby. A change in hormones during pregnancy causes your breasts to make breast milk in your milk-producing glands. Hormones prevent breast milk from being released before your baby is born. They also prompt milk flow after birth. Once breastfeeding has begun, thoughts of your baby, as well as his or her sucking or crying, can stimulate the release of milk from your milk-producing glands. Benefits of breastfeeding Research shows that breastfeeding offers many health benefits for infants and mothers. It also offers a cost-free and convenient way to feed your baby. For your baby  Your first milk (colostrum) helps your baby's digestive system to function better.  Special cells in your milk (antibodies) help your baby to fight off infections.  Breastfed babies are less likely to develop asthma, allergies, obesity, or type 2 diabetes. They are also at lower risk for sudden infant death syndrome (SIDS).  Nutrients in breast milk are better able to meet your baby's needs compared to infant formula.  Breast milk improves your baby's brain development. For you  Breastfeeding helps to create a very special bond between you and your baby.  Breastfeeding is convenient. Breast milk costs nothing and is always available at the correct temperature.  Breastfeeding helps to burn calories. It helps you to lose the weight that you gained during pregnancy.  Breastfeeding makes your uterus return faster to its size before pregnancy. It also slows bleeding (lochia) after you give birth.  Breastfeeding helps to lower your risk of developing type 2 diabetes, osteoporosis, rheumatoid arthritis, cardiovascular disease, and breast, ovarian, uterine, and endometrial cancer later in life. Breastfeeding basics Starting breastfeeding  Find a comfortable place to sit or lie down, with your neck and back  well-supported.  Place a pillow or a rolled-up blanket under your baby to bring him or her to the level of your breast (if you are seated). Nursing pillows are specially designed to help support your arms and your baby while you breastfeed.  Make sure that your baby's tummy (abdomen) is facing your abdomen.  Gently massage your breast. With your fingertips, massage from the outer edges of your breast inward toward the nipple. This encourages milk flow. If your milk flows slowly, you may need to continue this action during the feeding.  Support your breast with 4 fingers underneath and your thumb above your nipple (make the letter "C" with your hand). Make sure your fingers are well away from your nipple and your baby's mouth.  Stroke your baby's lips gently with your finger or nipple.  When your baby's mouth is open wide enough, quickly bring your baby to your breast, placing your entire nipple and as much of the areola as possible into your baby's mouth. The areola is the colored area around your nipple. ? More areola should be visible above your baby's upper lip than below the lower lip. ? Your baby's lips should be opened and extended outward (flanged) to ensure an adequate, comfortable latch. ? Your baby's tongue should be between his or her lower gum and your breast.  Make sure that your baby's mouth is correctly positioned around your nipple (latched). Your baby's lips should create a seal on your breast and be turned out (everted).  It is common for your baby to suck about 2-3 minutes in order to start the flow of breast milk. Latching Teaching your baby how to latch onto your breast properly is very  important. An improper latch can cause nipple pain, decreased milk supply, and poor weight gain in your baby. Also, if your baby is not latched onto your nipple properly, he or she may swallow some air during feeding. This can make your baby fussy. Burping your baby when you switch breasts  during the feeding can help to get rid of the air. However, teaching your baby to latch on properly is still the best way to prevent fussiness from swallowing air while breastfeeding. Signs that your baby has successfully latched onto your nipple  Silent tugging or silent sucking, without causing you pain. Infant's lips should be extended outward (flanged).  Swallowing heard between every 3-4 sucks once your milk has started to flow (after your let-down milk reflex occurs).  Muscle movement above and in front of his or her ears while sucking.  Signs that your baby has not successfully latched onto your nipple  Sucking sounds or smacking sounds from your baby while breastfeeding.  Nipple pain.  If you think your baby has not latched on correctly, slip your finger into the corner of your baby's mouth to break the suction and place it between your baby's gums. Attempt to start breastfeeding again. Signs of successful breastfeeding Signs from your baby  Your baby will gradually decrease the number of sucks or will completely stop sucking.  Your baby will fall asleep.  Your baby's body will relax.  Your baby will retain a small amount of milk in his or her mouth.  Your baby will let go of your breast by himself or herself.  Signs from you  Breasts that have increased in firmness, weight, and size 1-3 hours after feeding.  Breasts that are softer immediately after breastfeeding.  Increased milk volume, as well as a change in milk consistency and color by the fifth day of breastfeeding.  Nipples that are not sore, cracked, or bleeding.  Signs that your baby is getting enough milk  Wetting at least 1-2 diapers during the first 24 hours after birth.  Wetting at least 5-6 diapers every 24 hours for the first week after birth. The urine should be clear or pale yellow by the age of 5 days.  Wetting 6-8 diapers every 24 hours as your baby continues to grow and develop.  At least 3  stools in a 24-hour period by the age of 5 days. The stool should be soft and yellow.  At least 3 stools in a 24-hour period by the age of 7 days. The stool should be seedy and yellow.  No loss of weight greater than 10% of birth weight during the first 3 days of life.  Average weight gain of 4-7 oz (113-198 g) per week after the age of 4 days.  Consistent daily weight gain by the age of 5 days, without weight loss after the age of 2 weeks. After a feeding, your baby may spit up a small amount of milk. This is normal. Breastfeeding frequency and duration Frequent feeding will help you make more milk and can prevent sore nipples and extremely full breasts (breast engorgement). Breastfeed when you feel the need to reduce the fullness of your breasts or when your baby shows signs of hunger. This is called "breastfeeding on demand." Signs that your baby is hungry include:  Increased alertness, activity, or restlessness.  Movement of the head from side to side.  Opening of the mouth when the corner of the mouth or cheek is stroked (rooting).  Increased sucking sounds,  smacking lips, cooing, sighing, or squeaking.  Hand-to-mouth movements and sucking on fingers or hands.  Fussing or crying.  Avoid introducing a pacifier to your baby in the first 4-6 weeks after your baby is born. After this time, you may choose to use a pacifier. Research has shown that pacifier use during the first year of a baby's life decreases the risk of sudden infant death syndrome (SIDS). Allow your baby to feed on each breast as long as he or she wants. When your baby unlatches or falls asleep while feeding from the first breast, offer the second breast. Because newborns are often sleepy in the first few weeks of life, you may need to awaken your baby to get him or her to feed. Breastfeeding times will vary from baby to baby. However, the following rules can serve as a guide to help you make sure that your baby is  properly fed:  Newborns (babies 40 weeks of age or younger) may breastfeed every 1-3 hours.  Newborns should not go without breastfeeding for longer than 3 hours during the day or 5 hours during the night.  You should breastfeed your baby a minimum of 8 times in a 24-hour period.  Breast milk pumping Pumping and storing breast milk allows you to make sure that your baby is exclusively fed your breast milk, even at times when you are unable to breastfeed. This is especially important if you go back to work while you are still breastfeeding, or if you are not able to be present during feedings. Your lactation consultant can help you find a method of pumping that works best for you and give you guidelines about how long it is safe to store breast milk. Caring for your breasts while you breastfeed Nipples can become dry, cracked, and sore while breastfeeding. The following recommendations can help keep your breasts moisturized and healthy:  Avoid using soap on your nipples.  Wear a supportive bra designed especially for nursing. Avoid wearing underwire-style bras or extremely tight bras (sports bras).  Air-dry your nipples for 3-4 minutes after each feeding.  Use only cotton bra pads to absorb leaked breast milk. Leaking of breast milk between feedings is normal.  Use lanolin on your nipples after breastfeeding. Lanolin helps to maintain your skin's normal moisture barrier. Pure lanolin is not harmful (not toxic) to your baby. You may also hand express a few drops of breast milk and gently massage that milk into your nipples and allow the milk to air-dry.  In the first few weeks after giving birth, some women experience breast engorgement. Engorgement can make your breasts feel heavy, warm, and tender to the touch. Engorgement peaks within 3-5 days after you give birth. The following recommendations can help to ease engorgement:  Completely empty your breasts while breastfeeding or pumping. You  may want to start by applying warm, moist heat (in the shower or with warm, water-soaked hand towels) just before feeding or pumping. This increases circulation and helps the milk flow. If your baby does not completely empty your breasts while breastfeeding, pump any extra milk after he or she is finished.  Apply ice packs to your breasts immediately after breastfeeding or pumping, unless this is too uncomfortable for you. To do this: ? Put ice in a plastic bag. ? Place a towel between your skin and the bag. ? Leave the ice on for 20 minutes, 2-3 times a day.  Make sure that your baby is latched on and positioned properly while  breastfeeding.  If engorgement persists after 48 hours of following these recommendations, contact your health care provider or a Science writer. Overall health care recommendations while breastfeeding  Eat 3 healthy meals and 3 snacks every day. Well-nourished mothers who are breastfeeding need an additional 450-500 calories a day. You can meet this requirement by increasing the amount of a balanced diet that you eat.  Drink enough water to keep your urine pale yellow or clear.  Rest often, relax, and continue to take your prenatal vitamins to prevent fatigue, stress, and low vitamin and mineral levels in your body (nutrient deficiencies).  Do not use any products that contain nicotine or tobacco, such as cigarettes and e-cigarettes. Your baby may be harmed by chemicals from cigarettes that pass into breast milk and exposure to secondhand smoke. If you need help quitting, ask your health care provider.  Avoid alcohol.  Do not use illegal drugs or marijuana.  Talk with your health care provider before taking any medicines. These include over-the-counter and prescription medicines as well as vitamins and herbal supplements. Some medicines that may be harmful to your baby can pass through breast milk.  It is possible to become pregnant while breastfeeding. If  birth control is desired, ask your health care provider about options that will be safe while breastfeeding your baby. Where to find more information: [employer]: www.llli.org Contact a health care provider if:  You feel like you want to stop breastfeeding or have become frustrated with breastfeeding.  Your nipples are cracked or bleeding.  Your breasts are red, tender, or warm.  You have: ? Painful breasts or nipples. ? A swollen area on either breast. ? A fever or chills. ? Nausea or vomiting. ? Drainage other than breast milk from your nipples.  Your breasts do not become full before feedings by the fifth day after you give birth.  You feel sad and depressed.  Your baby is: ? Too sleepy to eat well. ? Having trouble sleeping. ? More than 25 week old and wetting fewer than 6 diapers in a 24-hour period. ? Not gaining weight by 41 days of age.  Your baby has fewer than 3 stools in a 24-hour period.  Your baby's skin or the white parts of his or her eyes become yellow. Get help right away if:  Your baby is overly tired (lethargic) and does not want to wake up and feed.  Your baby develops an unexplained fever. Summary  Breastfeeding offers many health benefits for infant and mothers.  Try to breastfeed your infant when he or she shows early signs of hunger.  Gently tickle or stroke your baby's lips with your finger or nipple to allow the baby to open his or her mouth. Bring the baby to your breast. Make sure that much of the areola is in your baby's mouth. Offer one side and burp the baby before you offer the other side.  Talk with your health care provider or lactation consultant if you have questions or you face problems as you breastfeed. This information is not intended to replace advice given to you by your health care provider. Make sure you discuss any questions you have with your health care provider. Document Released: 07/20/2005 Document  Revised: 08/21/2016 Document Reviewed: 08/21/2016 Elsevier Interactive Patient Education  2018 [employer]. Cord Blood Banking Information Cord blood banking is the process of collecting and storing the blood that is in the umbilical cord and placenta at the time of delivery.  This blood contains stem cells, which can be used to treat many blood diseases, immune system disorders, and childhood cancers. Stem cells can also be used to research certain diseases and treatments. Many people who choose cord blood banking donate the blood. Donated blood can be used in lifesaving treatments or for research. Other people choose to store the blood privately. Blood that is stored privately can only be used with the person's permission. This option is often chosen if:  A family member needs a stem cell transplant.  The child is part of an ethnic minority.  The child was conceived through in vitro fertilization.  What should I look for in a blood bank? A blood bank is the organization that coordinates cord blood banking. Make sure the cord blood bank that you use:  Is accredited.  Is financially stable.  Handles a large volume of cord blood samples.  Has a procedure in place for transport and storage.  Allows you the option of transferring your cord blood sample.  Has a procedure in place if the bank goes out of business.  Clearly states all costs and limits to future costs.  People who choose to donate cord blood should not need to pay for blood banking. People who keep the blood for private use will need to pay for the first (initial) storage and pay a fee each year (annual fee). Other fees may also apply. What are the risks of cord blood banking? There are no health risks associated with cord blood banking. It is considered safe. How should I prepare? You must schedule this process at least 4-6 weeks before you will be giving birth. How is the blood collected? The blood is collected as soon  as the baby has been delivered. Within 15 minutes of delivery, a health care provider will take these actions to collect the blood:  Clamp the umbilical cord at the top and bottom. This traps the blood in the umbilical cord.  Use a syringe or bag to collect the blood.  Insert needles into the placenta to collect (draw out) more blood.  What happens after the blood is collected? After the blood has been collected:  The blood will be sent to a blood bank.  The blood will be tested for genetic problems and infectious diseases. If the blood tests positive for a genetic problem or a disease, someone will contact you and let you know.  The blood will be frozen.  If your child develops a genetic condition, immune system disorder, or cancer, you will be responsible for contacting the blood bank and letting them know. This information is not intended to replace advice given to you by your health care provider. Make sure you discuss any questions you have with your health care provider. Document Released: 01/07/2010 Document Revised: 12/26/2015 Document Reviewed: 01/07/2015 Elsevier Interactive Patient Education  2018 [place]. Abdominal Pain During Pregnancy Belly (abdominal) pain is common during pregnancy. Most of the time, it is not a serious problem. Other times, it can be a sign that something is wrong with the pregnancy. Always tell your doctor if you have belly pain. Follow these instructions at home: Monitor your belly pain for any changes. The following actions may help you feel better:  Do not have sex (intercourse) or put anything in your vagina until you feel better.  Rest until your pain stops.  Drink clear fluids if you feel sick to your stomach (nauseous). Do not eat solid food until you feel better.  Only take medicine as told by your doctor.  Keep all doctor visits as told.  Get help right away if:  You are bleeding, leaking fluid, or pieces of tissue come out of  your vagina.  You have more pain or cramping.  You keep throwing up (vomiting).  You have pain when you pee (urinate) or have blood in your pee.  You have a fever.  You do not feel your baby moving as much.  You feel very weak or feel like passing out.  You have trouble breathing, with or without belly pain.  You have a very bad headache and belly pain.  You have fluid leaking from your vagina and belly pain.  You keep having watery poop (diarrhea).  Your belly pain does not go away after resting, or the pain gets worse. This information is not intended to replace advice given to you by your health care provider. Make sure you discuss any questions you have with your health care provider. Document Released: 07/08/2009 Document Revised: 02/26/2016 Document Reviewed: 02/16/2013 Elsevier Interactive Patient Education  2018 [place]. Back Pain in Pregnancy Back pain during pregnancy is common. Back pain may be caused by several factors that are related to changes during your pregnancy. Follow these instructions at home: Managing pain, stiffness, and swelling  If directed, apply ice for sudden (acute) back pain. ? Put ice in a plastic bag. ? Place a towel between your skin and the bag. ? Leave the ice on for 20 minutes, 2-3 times per day.  If directed, apply heat to the affected area before you exercise: ? Place a towel between your skin and the heat pack or heating pad. ? Leave the heat on for 20-30 minutes. ? Remove the heat if your skin turns bright red. This is especially important if you are unable to feel pain, heat, or cold. You may have a greater risk of getting burned. Activity  Exercise as told by your health care provider. Exercising is the best way to prevent or manage back pain.  Listen to your body when lifting. If lifting hurts, ask for help or bend your knees. This uses your leg muscles instead of your back muscles.  Squat down when picking up something  from the floor. Do not bend over.  Only use bed rest as told by your health care provider. Bed rest should only be used for the most severe episodes of back pain. Standing, Sitting, and Lying Down  Do not stand in one place for long periods of time.  Use good posture when sitting. Make sure your head rests over your shoulders and is not hanging forward. Use a pillow on your lower back if necessary.  Try sleeping on your side, preferably the left side, with a pillow or two between your legs. If you are sore after a night's rest, your bed may be too soft. A firm mattress may provide more support for your back during pregnancy. General instructions  Do not wear high heels.  Eat a healthy diet. Try to gain weight within your health care provider's recommendations.  Use a maternity girdle, elastic sling, or back brace as told by your health care provider.  Take over-the-counter and prescription medicines only as told by your health care provider.  Keep all follow-up visits as told by your health care provider. This is important. This includes any visits with any specialists, such as a physical therapist. Contact a health care provider if:  Your back pain interferes  with your daily activities.  You have increasing pain in other parts of your body. Get help right away if:  You develop numbness, tingling, weakness, or problems with the use of your arms or legs.  You develop severe back pain that is not controlled with medicine.  You have a sudden change in bowel or bladder control.  You develop shortness of breath, dizziness, or you faint.  You develop nausea, vomiting, or sweating.  You have back pain that is a rhythmic, cramping pain similar to labor pains. Labor pain is usually 1-2 minutes apart, lasts for about 1 minute, and involves a bearing down feeling or pressure in your pelvis.  You have back pain and your water breaks or you have vaginal bleeding.  You have back pain or  numbness that travels down your leg.  Your back pain developed after you fell.  You develop pain on one side of your back.  You see blood in your urine.  You develop skin blisters in the area of your back pain. This information is not intended to replace advice given to you by your health care provider. Make sure you discuss any questions you have with your health care provider. Document Released: 10/28/2005 Document Revised: 12/26/2015 Document Reviewed: 04/03/2015 Elsevier Interactive Patient Education  2018 [employer]. Common Medications Safe in Pregnancy  Acne:      Constipation:  Benzoyl Peroxide     Colace  Clindamycin      Dulcolax Suppository  Topica Erythromycin     Fibercon  Salicylic Acid      Metamucil         Miralax AVOID:        Senakot   Accutane    Cough:  Retin-A       Cough Drops  Tetracycline      Phenergan w/ Codeine if Rx  Minocycline      Robitussin (Plain & DM)  Antibiotics:     Crabs/Lice:  Ceclor       RID  Cephalosporins    AVOID:  E-Mycins      Kwell  Keflex  Macrobid/Macrodantin   Diarrhea:  Penicillin      Kao-Pectate  Zithromax      Imodium AD         PUSH FLUIDS AVOID:       Cipro     Fever:  Tetracycline      Tylenol (Regular or Extra  Minocycline       Strength)  Levaquin      Extra Strength-Do not          Exceed 8 tabs/24 hrs Caffeine:        <232m/day (equiv. To 1 cup of coffee or  approx. 3 12 oz sodas)         Gas: Cold/Hayfever:       Gas-X  Benadryl      Mylicon  Claritin       Phazyme  **Claritin-D        Chlor-Trimeton    Headaches:  Dimetapp      ASA-Free Excedrin  Drixoral-Non-Drowsy     Cold Compress  Mucinex (Guaifenasin)     Tylenol (Regular or Extra  Sudafed/Sudafed-12 Hour     Strength)  **Sudafed PE Pseudoephedrine   Tylenol Cold & Sinus     Vicks Vapor Rub  Zyrtec  **AVOID if Problems With Blood Pressure         Heartburn: Avoid lying down for at least 1 hour after  meals  Aciphex      Maalox     Rash:  Milk of Magnesia     Benadryl    Mylanta       1% Hydrocortisone Cream  Pepcid  Pepcid Complete   Sleep Aids:  Prevacid      Ambien   Prilosec       Benadryl  Rolaids       Chamomile Tea  Tums (Limit 4/day)     Unisom  Zantac       Tylenol PM         Warm milk-add vanilla or  Hemorrhoids:       Sugar for taste  Anusol/Anusol H.C.  (RX: Analapram 2.5%)  Sugar Substitutes:  Hydrocortisone OTC     Ok in moderation  Preparation H      Tucks        Vaseline lotion applied to tissue with wiping    Herpes:     Throat:  Acyclovir      Oragel  Famvir  Valtrex     Vaccines:         Flu Shot Leg Cramps:       *Gardasil  Benadryl      Hepatitis A         Hepatitis B Nasal Spray:       Pneumovax  Saline Nasal Spray     Polio Booster         Tetanus Nausea:       Tuberculosis test or PPD  Vitamin B6 25 mg TID   AVOID:    Dramamine      *Gardasil  Emetrol       Live Poliovirus  Ginger Root 250 mg QID    MMR (measles, mumps &  High Complex Carbs @ Bedtime    rebella)  Sea Bands-Accupressure    Varicella (Chickenpox)  Unisom 1/2 tab TID     *No known complications           If received before Pain:         Known pregnancy;   Darvocet       Resume series after  Lortab        Delivery  Percocet    Yeast:   Tramadol      Femstat  Tylenol 3      Gyne-lotrimin  Ultram       Monistat  Vicodin           MISC:         All Sunscreens           Hair Coloring/highlights          Insect Repellant's          (Including DEET)         Mystic Tans Tdap Vaccine (Tetanus, Diphtheria and Pertussis): What You Need to Know 1. Why get vaccinated? Tetanus, diphtheria and pertussis are very serious diseases. Tdap vaccine can protect [place] from these diseases. And, Tdap vaccine given to pregnant women can protect newborn babies against pertussis. TETANUS (Lockjaw) is rare in the [place] today. It causes painful muscle tightening and stiffness, usually all over  the body.  It can lead to tightening of muscles in the head and neck so you can't open your mouth, swallow, or sometimes even breathe. Tetanus kills about 1 out of 10 people who are infected even after receiving the best medical care.  DIPHTHERIA is also rare in the [place] today. It can cause  a thick coating to form in the back of the throat.  It can lead to breathing problems, heart failure, paralysis, and death.  PERTUSSIS (Whooping Cough) causes severe coughing spells, which can cause difficulty breathing, vomiting and disturbed sleep.  It can also lead to weight loss, incontinence, and rib fractures. Up to 2 in 100 adolescents and 5 in 100 adults with pertussis are hospitalized or have complications, which could include pneumonia or death.  These diseases are caused by bacteria. Diphtheria and pertussis are spread from person to person through secretions from coughing or sneezing. Tetanus enters the body through cuts, scratches, or wounds. Before vaccines, as many as 200,000 cases of diphtheria, 200,000 cases of pertussis, and hundreds of cases of tetanus, were reported in the [place] each year. Since vaccination began, reports of cases for tetanus and diphtheria have dropped by about 99% and for pertussis by about 80%. 2. Tdap vaccine Tdap vaccine can protect adolescents and adults from tetanus, diphtheria, and pertussis. One dose of Tdap is routinely given at age 54 or 19. People who did not get Tdap at that age should get it as soon as possible. Tdap is especially important for healthcare professionals and anyone having close contact with a baby younger than 12 months. Pregnant women should get a dose of Tdap during every pregnancy, to protect the newborn from pertussis. Infants are most at risk for severe, life-threatening complications from pertussis. Another vaccine, called Td, protects against tetanus and diphtheria, but not pertussis. A Td booster should be given every 10  years. Tdap may be given as one of these boosters if you have never gotten Tdap before. Tdap may also be given after a severe cut or burn to prevent tetanus infection. Your doctor or the person giving you the vaccine can give you more information. Tdap may safely be given at the same time as other vaccines. 3. Some people should not get this vaccine  A person who has ever had a life-threatening allergic reaction after a previous dose of any diphtheria, tetanus or pertussis containing vaccine, OR has a severe allergy to any part of this vaccine, should not get Tdap vaccine. Tell the person giving the vaccine about any severe allergies.  Anyone who had coma or long repeated seizures within 7 days after a childhood dose of DTP or DTaP, or a previous dose of Tdap, should not get Tdap, unless a cause other than the vaccine was found. They can still get Td.  Talk to your doctor if you: ? have seizures or another nervous system problem, ? had severe pain or swelling after any vaccine containing diphtheria, tetanus or pertussis, ? ever had a condition called Guillain-Barr Syndrome (GBS), ? aren't feeling well on the day the shot is scheduled. 4. Risks With any medicine, including vaccines, there is a chance of side effects. These are usually mild and go away on their own. Serious reactions are also possible but are rare. Most people who get Tdap vaccine do not have any problems with it. Mild problems following Tdap: (Did not interfere with activities)  Pain where the shot was given (about 3 in 4 adolescents or 2 in 3 adults)  Redness or swelling where the shot was given (about 1 person in 5)  Mild fever of at least 100.33F (up to about 1 in 25 adolescents or 1 in 100 adults)  Headache (about 3 or 4 people in 10)  Tiredness (about 1 person in 3 or 4)  Nausea, vomiting, diarrhea,  stomach ache (up to 1 in 4 adolescents or 1 in 10 adults)  Chills, sore joints (about 1 person in 10)  Body aches  (about 1 person in 3 or 4)  Rash, swollen glands (uncommon)  Moderate problems following Tdap: (Interfered with activities, but did not require medical attention)  Pain where the shot was given (up to 1 in 5 or 6)  Redness or swelling where the shot was given (up to about 1 in 16 adolescents or 1 in 12 adults)  Fever over 102F (about 1 in 100 adolescents or 1 in 250 adults)  Headache (about 1 in 7 adolescents or 1 in 10 adults)  Nausea, vomiting, diarrhea, stomach ache (up to 1 or 3 people in 100)  Swelling of the entire arm where the shot was given (up to about 1 in 500).  Severe problems following Tdap: (Unable to perform usual activities; required medical attention)  Swelling, severe pain, bleeding and redness in the arm where the shot was given (rare).  Problems that could happen after any vaccine:  People sometimes faint after a medical procedure, including vaccination. Sitting or lying down for about 15 minutes can help prevent fainting, and injuries caused by a fall. Tell your doctor if you feel dizzy, or have vision changes or ringing in the ears.  Some people get severe pain in the shoulder and have difficulty moving the arm where a shot was given. This happens very rarely.  Any medication can cause a severe allergic reaction. Such reactions from a vaccine are very rare, estimated at fewer than 1 in a million doses, and would happen within a few minutes to a few hours after the vaccination. As with any medicine, there is a very remote chance of a vaccine causing a serious injury or death. The safety of vaccines is always being monitored. For more information, visit: http://www.aguilar.org/ 5. What if there is a serious problem? What should I look for? Look for anything that concerns you, such as signs of a severe allergic reaction, very high fever, or unusual behavior. Signs of a severe allergic reaction can include hives, swelling of the face and throat, difficulty  breathing, a fast heartbeat, dizziness, and weakness. These would usually start a few minutes to a few hours after the vaccination. What should I do?  If you think it is a severe allergic reaction or other emergency that can't wait, call 9-1-1 or get the person to the nearest hospital. Otherwise, call your doctor.  Afterward, the reaction should be reported to the Vaccine Adverse Event Reporting System (VAERS). Your doctor might file this report, or you can do it yourself through the VAERS web site at www.vaers.SamedayNews.es, or by calling 743-642-2592. ? VAERS does not give medical advice. 6. The National Vaccine Injury Compensation Program The [employer] Vaccine Injury Compensation Program (VICP) is a federal program that was created to compensate people who may have been injured by certain vaccines. Persons who believe they may have been injured by a vaccine can learn about the program and about filing a claim by calling 7725381417 or visiting the [place] website at GoldCloset.com.ee. There is a time limit to file a claim for compensation. 7. How can I learn more?  Ask your doctor. He or she can give you the vaccine package insert or suggest other sources of information.  Call your local or state health department.  Contact the Centers for Disease Control and Prevention (CDC): ? Call 682 580 3995 (1-800-CDC-INFO) or ? Visit CDC's website at http://hunter.com/  CDC Tdap Vaccine VIS (09/26/13) This information is not intended to replace advice given to you by your health care provider. Make sure you discuss any questions you have with your health care provider. Document Released: 01/19/2012 Document Revised: 04/09/2016 Document Reviewed: 04/09/2016 Elsevier Interactive Patient Education  2017 [place] of Pregnancy The third trimester is from week 29 through week 42, months 7 through 9. This trimester is when your unborn baby (fetus) is growing very fast.  At the end of the ninth month, the unborn baby is about 20 inches in length. It weighs about 6-10 pounds. Follow these instructions at home:  Avoid all smoking, herbs, and alcohol. Avoid drugs not approved by your doctor.  Do not use any tobacco products, including cigarettes, chewing tobacco, and electronic cigarettes. If you need help quitting, ask your doctor. You may get counseling or other support to help you quit.  Only take medicine as told by your doctor. Some medicines are safe and some are not during pregnancy.  Exercise only as told by your doctor. Stop exercising if you start having cramps.  Eat regular, healthy meals.  Wear a good support bra if your breasts are tender.  Do not use hot tubs, steam rooms, or saunas.  Wear your seat belt when driving.  Avoid raw meat, uncooked cheese, and liter boxes and soil used by cats.  Take your prenatal vitamins.  Take 1500-2000 milligrams of calcium daily starting at the 20th week of pregnancy until you deliver your baby.  Try taking medicine that helps you poop (stool softener) as needed, and if your doctor approves. Eat more fiber by eating fresh fruit, vegetables, and whole grains. Drink enough fluids to keep your pee (urine) clear or pale yellow.  Take warm water baths (sitz baths) to soothe pain or discomfort caused by hemorrhoids. Use hemorrhoid cream if your doctor approves.  If you have puffy, bulging veins (varicose veins), wear support hose. Raise (elevate) your feet for 15 minutes, 3-4 times a day. Limit salt in your diet.  Avoid heavy lifting, wear low heels, and sit up straight.  Rest with your legs raised if you have leg cramps or low back pain.  Visit your dentist if you have not gone during your pregnancy. Use a soft toothbrush to brush your teeth. Be gentle when you floss.  You can have sex (intercourse) unless your doctor tells you not to.  Do not travel far distances unless you must. Only do so with your  doctor's approval.  Take prenatal classes.  Practice driving to the hospital.  Pack your hospital bag.  Prepare the baby's room.  Go to your doctor visits. Get help if:  You are not sure if you are in labor or if your water has broken.  You are dizzy.  You have mild cramps or pressure in your lower belly (abdominal).  You have a nagging pain in your belly area.  You continue to feel sick to your stomach (nauseous), throw up (vomit), or have watery poop (diarrhea).  You have bad smelling fluid coming from your vagina.  You have pain with peeing (urination). Get help right away if:  You have a fever.  You are leaking fluid from your vagina.  You are spotting or bleeding from your vagina.  You have severe belly cramping or pain.  You lose or gain weight rapidly.  You have trouble catching your breath and have chest pain.  You notice sudden or extreme puffiness (swelling) of  your face, hands, ankles, feet, or legs.  You have not felt the baby move in over an hour.  You have severe headaches that do not go away with medicine.  You have vision changes. This information is not intended to replace advice given to you by your health care provider. Make sure you discuss any questions you have with your health care provider. Document Released: 10/14/2009 Document Revised: 12/26/2015 Document Reviewed: 09/20/2012 Elsevier Interactive Patient Education  2017 [employer].

## 2017-10-29 NOTE — Progress Notes (Signed)
ROB-Doing well, growth US wnl today. TDaP given. Glucola, CBC, and RPR today. Blood transfusion consent reviewed and signed. Body mass index is 48.83 kg/m. FMLA paperwork completed. Plans breastfeeding. Education and handouts regarding [facility]la program, cord blood banking options, postpartum contraception, and childbirth classes. Reviewed red flag symptoms and when to call. RTC x 2 weeks for ROB or sooner if needed.    ULTRASOUND REPORT  Location: [facility] Date of Service:  10/29/2017  Indications: Growth Findings:  Singleton intrauterine pregnancy is visualized with FHR at 133 BPM. Biometrics give an (U/S) Gestational age of 30 2/7 weeks and an (U/S) EDD of 01/05/18; this correlates with the clinically established EDD of 01/16/18.  Fetal presentation is vertex.  EFW: 1481 grams (3lb 4oz).  63rd percentile.  BPD measures 92nd, HC measures 73rd, AC measures 79th, and FL measures 60th percentile. Placenta: Anterior and grade 1. AFI: 13.5 cm.  Fetal stomach, kidneys, and bladder appear WNL.  Impression: 1. 30 2/7 week Viable Singleton Intrauterine pregnancy by U/S. 2. (U/S) EDD is consistent with Clinically established (LMP) EDD of 01/16/18. 3. EFW: 1481 grams (3lb 4oz).  63rd percentile.  Recommendations: 1.Clinical correlation with the patient's History and Physical Exam.

## 2017-10-29 NOTE — Progress Notes (Signed)
ROB- GTT,BTC, and tdap.no complaints.

## 2017-10-30 LAB — CBC
HEMATOCRIT: 33.6 % — AB (ref 34.0–46.6)
HEMOGLOBIN: 10.8 g/dL — AB (ref 11.1–15.9)
MCH: 28.4 pg (ref 26.6–33.0)
MCHC: 32.1 g/dL (ref 31.5–35.7)
MCV: 88 fL (ref 79–97)
Platelets: 216 10*3/uL (ref 150–379)
RBC: 3.8 x10E6/uL (ref 3.77–5.28)
RDW: 13.9 % (ref 12.3–15.4)
WBC: 10.3 10*3/uL (ref 3.4–10.8)

## 2017-10-30 LAB — GLUCOSE TOLERANCE, 1 HOUR: Glucose, 1Hr PP: 176 mg/dL (ref 65–199)

## 2017-10-30 LAB — RPR: RPR Ser Ql: NONREACTIVE

## 2017-11-01 ENCOUNTER — Encounter: Payer: Self-pay | Admitting: Certified Nurse Midwife

## 2017-11-01 ENCOUNTER — Other Ambulatory Visit: Payer: Self-pay | Admitting: Certified Nurse Midwife

## 2017-11-01 ENCOUNTER — Other Ambulatory Visit: Payer: Self-pay

## 2017-11-01 ENCOUNTER — Telehealth: Payer: Self-pay | Admitting: *Deleted

## 2017-11-01 DIAGNOSIS — Z6841 Body Mass Index (BMI) 40.0 and over, adult: Secondary | ICD-10-CM | POA: Insufficient documentation

## 2017-11-01 DIAGNOSIS — O99810 Abnormal glucose complicating pregnancy: Secondary | ICD-10-CM | POA: Insufficient documentation

## 2017-11-01 DIAGNOSIS — Z3483 Encounter for supervision of other normal pregnancy, third trimester: Secondary | ICD-10-CM

## 2017-11-01 DIAGNOSIS — Z98891 History of uterine scar from previous surgery: Secondary | ICD-10-CM | POA: Insufficient documentation

## 2017-11-01 NOTE — Telephone Encounter (Signed)
Patient called and states that she was told that her iron was low and that she needed to take iron supplements. Patient is wondering if she can be called in a RX for that . [facility] pharac[facility] [place]. Please advise. Thank you

## 2017-11-03 ENCOUNTER — Encounter: Payer: Self-pay | Admitting: Certified Nurse Midwife

## 2017-11-03 ENCOUNTER — Other Ambulatory Visit: Payer: Self-pay

## 2017-11-03 ENCOUNTER — Other Ambulatory Visit: Payer: BC Managed Care – PPO

## 2017-11-03 DIAGNOSIS — Z3483 Encounter for supervision of other normal pregnancy, third trimester: Secondary | ICD-10-CM

## 2017-11-03 DIAGNOSIS — O99810 Abnormal glucose complicating pregnancy: Secondary | ICD-10-CM

## 2017-11-04 LAB — GESTATIONAL GLUCOSE TOLERANCE
GLUCOSE 1 HOUR GTT: 164 mg/dL (ref 65–179)
GLUCOSE 2 HOUR GTT: 139 mg/dL (ref 65–154)
Glucose, Fasting: 79 mg/dL (ref 65–94)
Glucose, GTT - 3 Hour: 100 mg/dL (ref 65–139)

## 2017-11-05 ENCOUNTER — Encounter: Payer: BC Managed Care – PPO | Admitting: Certified Nurse Midwife

## 2017-11-09 ENCOUNTER — Other Ambulatory Visit: Payer: Self-pay | Admitting: Certified Nurse Midwife

## 2017-11-12 ENCOUNTER — Ambulatory Visit (INDEPENDENT_AMBULATORY_CARE_PROVIDER_SITE_OTHER): Payer: BC Managed Care – PPO | Admitting: Certified Nurse Midwife

## 2017-11-12 VITALS — BP 123/83 | HR 98 | Wt 282.4 lb

## 2017-11-12 DIAGNOSIS — Z3483 Encounter for supervision of other normal pregnancy, third trimester: Secondary | ICD-10-CM

## 2017-11-12 LAB — POCT URINALYSIS DIPSTICK
BILIRUBIN UA: NEGATIVE
GLUCOSE UA: NEGATIVE
Ketones, UA: NEGATIVE
LEUKOCYTES UA: NEGATIVE
Nitrite, UA: NEGATIVE
RBC UA: NEGATIVE
UROBILINOGEN UA: 0.2 U/dL
pH, UA: 7.5 (ref 5.0–8.0)

## 2017-11-12 NOTE — Progress Notes (Signed)
Pt is here for an ROB visit. 

## 2017-11-12 NOTE — Progress Notes (Signed)
Body mass index is 48.47 kg/m.   ROB, doing well. Discussed birth control . She is wanting her partner to get a vasectomy but he is not interested at this time. She is going to continue to talk with him about it. She feels good movement. Follow up 2 wk.  Doreene BurkeAnnie Shelanda Duvall, CNM

## 2017-11-12 NOTE — Patient Instructions (Signed)

## 2017-11-25 ENCOUNTER — Ambulatory Visit (INDEPENDENT_AMBULATORY_CARE_PROVIDER_SITE_OTHER): Payer: BC Managed Care – PPO | Admitting: Obstetrics and Gynecology

## 2017-11-25 VITALS — BP 110/65 | HR 85 | Wt 284.4 lb

## 2017-11-25 DIAGNOSIS — Z3493 Encounter for supervision of normal pregnancy, unspecified, third trimester: Secondary | ICD-10-CM

## 2017-11-25 NOTE — Progress Notes (Signed)
ROB-doing well reports pressure but no contractions,  Will see physicians from this point forward due to BMI high risk and repeat c/s planned.

## 2017-11-25 NOTE — Progress Notes (Signed)
ROB- pt is having some fatigue, otherwise she is doing well

## 2017-11-25 NOTE — Patient Instructions (Signed)
Postpartum Tubal Ligation, Care After Refer to this sheet in the next few weeks. These instructions provide you with information about caring for yourself after your procedure. Your health care provider may also give you more specific instructions. Your treatment has been planned according to current medical practices, but problems sometimes occur. Call your health care provider if you have any problems or questions after your procedure. What can I expect after the procedure? After the procedure, it is common to have:  A sore throat.  Bruising or pain in your back.  Nausea or vomiting.  Dizziness.  Mild abdominal discomfort or pain, such as cramping, gas pain, or feeling bloated.  Soreness where the incision was made.  Tiredness.  Pain in your shoulders.  Follow these instructions at home: Medicines  Take over-the-counter and prescription medicines only as told by your health care provider.  Do not take aspirin because it can cause bleeding.  Do not drive or operate heavy machinery while taking prescription pain medicine. Activity  Rest for the rest of the day.  Gradually return to your normal activities over the next few days.  Do not have sex, douche, or put a tampon or anything else in your vagina for 6 weeks or as long as told by your health care provider.  Do not lift anything that is heavier than your baby for 2 weeks or as long as told by your health care provider. Incision care  Follow instructions from your health care provider about how to take care of your incision. Make sure you: ? Wash your hands with soap and water before you change your bandage (dressing). If soap and water are not available, use hand sanitizer. ? Change your dressing as told by your health care provider. ? Leave stitches (sutures) in place. They may need to stay in place for 2 weeks or longer.  Check your incision area every day for signs of infection. Check for: ? More redness, swelling,  or pain. ? More fluid or blood. ? Warmth. ? Pus or a bad smell. Other Instructions  Do not take baths, swim, or use a hot tub until your health care provider approves. You may take showers.  Keep all follow-up visits as told by your health care provider. This is important. Contact a health care provider if:  You have more redness, swelling, or pain around your incision.  Your incision feels warm to the touch.  You have pus or a bad smell coming from your incision.  The edges of your incision break open after the sutures have been removed.  Your pain does not improve after 2-3 days.  You have a rash.  You repeatedly become dizzy or lightheaded.  Your pain medicine is not helping.  You are constipated. Get help right away if:  You have a fever.  You faint.  You have pain in your abdomen that gets worse.  You have fluid or blood coming from your sutures.  You have shortness of breath or difficulty breathing.  You have chest pain or leg pain.  You have ongoing nausea or diarrhea. This information is not intended to replace advice given to you by your health care provider. Make sure you discuss any questions you have with your health care provider. Document Released: 01/19/2012 Document Revised: 12/23/2015 Document Reviewed: 06/30/2015 Elsevier Interactive Patient Education  2018 [employer] CorporationBraxton Hicks Contractions Contractions of the uterus can occur throughout pregnancy, but they are not always a sign that you are in labor. You  may have practice contractions called Braxton Hicks contractions. These false labor contractions are sometimes confused with true labor. What are Deberah Pelton contractions? Braxton Hicks contractions are tightening movements that occur in the muscles of the uterus before labor. Unlike true labor contractions, these contractions do not result in opening (dilation) and thinning of the cervix. Toward the end of pregnancy (32-34 weeks), Braxton  Hicks contractions can happen more often and may become stronger. These contractions are sometimes difficult to tell apart from true labor because they can be very uncomfortable. You should not feel embarrassed if you go to the hospital with false labor. Sometimes, the only way to tell if you are in true labor is for your health care provider to look for changes in the cervix. The health care provider will do a physical exam and may monitor your contractions. If you are not in true labor, the exam should show that your cervix is not dilating and your water has not broken. If there are other health problems associated with your pregnancy, it is completely safe for you to be sent home with false labor. You may continue to have Braxton Hicks contractions until you go into true labor. How to tell the difference between true labor and false labor True labor  Contractions last 30-70 seconds.  Contractions become very regular.  Discomfort is usually felt in the top of the uterus, and it spreads to the lower abdomen and low back.  Contractions do not go away with walking.  Contractions usually become more intense and increase in frequency.  The cervix dilates and gets thinner. False labor  Contractions are usually shorter and not as strong as true labor contractions.  Contractions are usually irregular.  Contractions are often felt in the front of the lower abdomen and in the groin.  Contractions may go away when you walk around or change positions while lying down.  Contractions get weaker and are shorter-lasting as time goes on.  The cervix usually does not dilate or become thin. Follow these instructions at home:  Take over-the-counter and prescription medicines only as told by your health care provider.  Keep up with your usual exercises and follow other instructions from your health care provider.  Eat and drink lightly if you think you are going into labor.  If Braxton Hicks  contractions are making you uncomfortable: ? Change your position from lying down or resting to walking, or change from walking to resting. ? Sit and rest in a tub of warm water. ? Drink enough fluid to keep your urine pale yellow. Dehydration may cause these contractions. ? Do slow and deep breathing several times an hour.  Keep all follow-up prenatal visits as told by your health care provider. This is important. Contact a health care provider if:  You have a fever.  You have continuous pain in your abdomen. Get help right away if:  Your contractions become stronger, more regular, and closer together.  You have fluid leaking or gushing from your vagina.  You pass blood-tinged mucus (bloody show).  You have bleeding from your vagina.  You have low back pain that you never had before.  You feel your baby's head pushing down and causing pelvic pressure.  Your baby is not moving inside you as much as it used to. Summary  Contractions that occur before labor are called Braxton Hicks contractions, false labor, or practice contractions.  Braxton Hicks contractions are usually shorter, weaker, farther apart, and less regular than true labor  contractions. True labor contractions usually become progressively stronger and regular and they become more frequent.  Manage discomfort from [facility] contractions by changing position, resting in a warm bath, drinking plenty of water, or practicing deep breathing. This information is not intended to replace advice given to you by your health care provider. Make sure you discuss any questions you have with your health care provider. Document Released: 12/03/2016 Document Revised: 12/03/2016 Document Reviewed: 12/03/2016 Elsevier Interactive Patient Education  2018 [employer].

## 2017-12-09 ENCOUNTER — Ambulatory Visit (INDEPENDENT_AMBULATORY_CARE_PROVIDER_SITE_OTHER): Payer: BC Managed Care – PPO | Admitting: Obstetrics and Gynecology

## 2017-12-09 VITALS — BP 118/75 | HR 92 | Wt 288.2 lb

## 2017-12-09 DIAGNOSIS — Z308 Encounter for other contraceptive management: Secondary | ICD-10-CM

## 2017-12-09 DIAGNOSIS — O0993 Supervision of high risk pregnancy, unspecified, third trimester: Secondary | ICD-10-CM

## 2017-12-09 DIAGNOSIS — Z98891 History of uterine scar from previous surgery: Secondary | ICD-10-CM

## 2017-12-09 HISTORY — DX: Morbid (severe) obesity due to excess calories: E66.01

## 2017-12-09 LAB — POCT URINALYSIS DIPSTICK
Bilirubin, UA: NEGATIVE
Glucose, UA: NEGATIVE
KETONES UA: NEGATIVE
Leukocytes, UA: NEGATIVE
Nitrite, UA: NEGATIVE
PH UA: 7.5 (ref 5.0–8.0)
Protein, UA: NEGATIVE
RBC UA: NEGATIVE
SPEC GRAV UA: 1.01 (ref 1.010–1.025)
UROBILINOGEN UA: 0.2 U/dL

## 2017-12-09 NOTE — Progress Notes (Signed)
ROB: Transitioning from midwifery care to MD care. Has h/o prior C-section x 1 desiring repeat with BTL.  Also with morbid obesity. Has had initial anesthesia consultation.  Doing well.  Does note right foot tingling from swelling. Denies pain. Discussed no further weight gain in pregnancy as BMI is approaching 50. Will need to begin NSTs weekly starting at 36 weeks. Discussed risks/benefits of repeat C-section.  Also discussed contraception options including permanent sterilization.  Patient desires to schedule surgery for 01/10/2018.  Will schedule. RTC in 2 weeks.

## 2017-12-09 NOTE — Patient Instructions (Signed)
Fetal Monitoring Overview Monitoring your developing baby (fetus) before birth can identify potential problems for you and your baby. Fetal monitoring can:  Help prevent serious problems from developing.  Guide health care providers in how best to care for your unborn baby.  Check your unborn baby's overall health.  The amount of monitoring and the specific tests that will be done will vary. It will depend on whether your pregnancy is considered to be low or high risk. For example, you may need certain tests if you have a medical problem that can put your unborn baby at risk. Health care providers use several techniques and tests to monitor your baby before birth. No single test is perfectly accurate, and you may need to follow up with your health care provider if there are any concerns. Fetal movement counts When you are past 20 weeks of pregnancy, you may feel your baby move. As your baby gets bigger, these movements are easier to feel. A fetal movement count is when you count the number of movements (kicks, flutters, swishes, rolls, or jabs) over a specific period of time. You record the number and report it to your health care provider. This is often recommended in high-risk pregnancies, but it is good for every pregnant woman to do. Your health care provider may ask you to start counting fetal movements at 28 weeks of pregnancy. Non-stress test The non-stress test:  Evaluates fetal heartbeat: ? While your baby is at rest. ? While your baby is moving.  Is often done as part of a set of tests called the biophysical profile.  May show signs that it is time for your baby to be delivered.  The heart rate in a healthy baby will speed up when the baby moves or kicks. The heart rate will decrease at rest, and the peaks or accelerations of the heart rate will be lower. If the test brings up any questions or concerns, you may need to have more testing. This test may be done if your pregnancy goes  past your due date. It is also commonly done in high-risk pregnancies beginning at 32 weeks. Biophysical profile The biophysical profile is a set of tests that are done to find out how well the baby is doing. It includes the non-stress test along with imaging tests that use sound waves (ultrasound) to create images of your baby. In a biophysical profile, the following tests are done and scored:  Fetal heartbeat.  Fetal breathing.  Fetal movements.  Fetal muscle tone.  Amount of amniotic fluid.  Each test gets a score of either 2 (normal) or 0 (abnormal). The scores are then added together for a total score. A low score may mean that you and your baby need additional monitoring or special care. Sometimes your health care provider may recommend that you deliver early. This test is usually done after 32 weeks of pregnancy. It can be done sooner, if needed. Contraction stress test A contraction stress test monitors the heart rate of your baby during contractions. The test checks to see how your baby will tolerate the stress of labor. Health care providers use this test to further evaluate your baby when other tests, such as the biophysical profile, have shown that there may be a problem. This test may be done:  Any time you are in labor.  Between weeks 32 and 34 of your pregnancy.  Later, if necessary.  Modified biophysical profile A modified biophysical profile is a two-part test. You will have:  An ultrasound exam, to check how much fluid is surrounding your baby inside of your womb (amniotic fluid).  A non-stress test, to check your baby's heart rate.  The results will determine whether a full biophysical profile may be needed. This test is usually done late in your final 3 months (third trimester) of pregnancy. Umbilical artery Doppler velocimetry Umbilical artery Doppler velocimetry uses sound waves to measure the flow of blood between you and your baby. This test:  Measures the  amount of blood flow through the cord that attaches your baby to your womb (umbilical cord).  Measures the speed of the blood flow.  You likely only need this test to check your baby's condition if you have a high-risk pregnancy. All types of monitoring aim to protect your health and that of your baby. The best way to have a healthy pregnancy and a healthy baby is to learn as much as you can about your pregnancy and to work closely with all your health care providers. This information is not intended to replace advice given to you by your health care provider. Make sure you discuss any questions you have with your health care provider. Document Released: 07/10/2002 Document Revised: 03/17/2016 Document Reviewed: 02/17/2016 Elsevier Interactive Patient Education  [employer].

## 2017-12-09 NOTE — Progress Notes (Signed)
ROB-pt having tingling in her right foot from swelling.

## 2017-12-13 ENCOUNTER — Telehealth: Payer: Self-pay | Admitting: *Deleted

## 2017-12-13 NOTE — Telephone Encounter (Signed)
Patient called and states she has been experiencing lower back pain that started yesterday. Patient states today she is experiencing some back pain but more vaginal pressure. Patient is requesting a call back. Her contact # is [phone]. Please advise. Thank you

## 2017-12-13 NOTE — Telephone Encounter (Signed)
Ob 35 1/7- c/o of back pain x 24 hours. Lying down did not help. She was able to sleep last nite with pillows. This am she has mild back pain and pressure in the vaginal area at times. NO uti sx. Diarrhea x 2 this morning. NO vb or log. Pos fm. Advised pt to push h20. Take tylenol 1000 q6h. Use heating pad. F/u at appt in 48h. Call sooner if pain worsens.

## 2017-12-15 ENCOUNTER — Ambulatory Visit (INDEPENDENT_AMBULATORY_CARE_PROVIDER_SITE_OTHER): Payer: BC Managed Care – PPO | Admitting: Obstetrics and Gynecology

## 2017-12-15 ENCOUNTER — Other Ambulatory Visit: Payer: BC Managed Care – PPO

## 2017-12-15 ENCOUNTER — Encounter: Payer: Self-pay | Admitting: Obstetrics and Gynecology

## 2017-12-15 VITALS — BP 108/72 | HR 97 | Wt 288.8 lb

## 2017-12-15 DIAGNOSIS — Z3685 Encounter for antenatal screening for Streptococcus B: Secondary | ICD-10-CM

## 2017-12-15 DIAGNOSIS — Z202 Contact with and (suspected) exposure to infections with a predominantly sexual mode of transmission: Secondary | ICD-10-CM

## 2017-12-15 DIAGNOSIS — O0993 Supervision of high risk pregnancy, unspecified, third trimester: Secondary | ICD-10-CM

## 2017-12-15 LAB — POCT URINALYSIS DIPSTICK
GLUCOSE UA: NEGATIVE
Ketones, UA: NEGATIVE
Nitrite, UA: NEGATIVE
ODOR: NEGATIVE
SPEC GRAV UA: 1.02 (ref 1.010–1.025)
Urobilinogen, UA: 0.2 E.U./dL
pH, UA: 6.5 (ref 5.0–8.0)

## 2017-12-15 NOTE — Progress Notes (Signed)
ROB- back pain 2 days ago. Tylenol and heating pad helps.

## 2017-12-15 NOTE — Progress Notes (Signed)
ROB: Patient had some back pain earlier in the week but it resolved with heating pad.  Patient doing well with no weight gain. GC/CT-GBS performed.  NST next week

## 2017-12-16 LAB — GC/CHLAMYDIA PROBE AMP
CHLAMYDIA, DNA PROBE: NEGATIVE
NEISSERIA GONORRHOEAE BY PCR: NEGATIVE

## 2017-12-17 LAB — STREP GP B NAA: Strep Gp B NAA: POSITIVE — AB

## 2017-12-23 ENCOUNTER — Ambulatory Visit (INDEPENDENT_AMBULATORY_CARE_PROVIDER_SITE_OTHER): Payer: BC Managed Care – PPO | Admitting: Obstetrics and Gynecology

## 2017-12-23 ENCOUNTER — Encounter: Payer: Self-pay | Admitting: Obstetrics and Gynecology

## 2017-12-23 VITALS — BP 138/81 | HR 96 | Wt 291.3 lb

## 2017-12-23 DIAGNOSIS — O0993 Supervision of high risk pregnancy, unspecified, third trimester: Secondary | ICD-10-CM | POA: Diagnosis not present

## 2017-12-23 LAB — POCT URINALYSIS DIPSTICK
Bilirubin, UA: NEGATIVE
Blood, UA: NEGATIVE
GLUCOSE UA: NEGATIVE
KETONES UA: NEGATIVE
NITRITE UA: NEGATIVE
PROTEIN UA: NEGATIVE
Spec Grav, UA: 1.01 (ref 1.010–1.025)
Urobilinogen, UA: 0.2 E.U./dL
pH, UA: 7.5 (ref 5.0–8.0)

## 2017-12-23 NOTE — Progress Notes (Signed)
ROB:  PT with some increased edema.  Neg proteinuria.  No other symptoms.  S&S of pre-E discussed. NONSTRESS TEST INTERPRETATION  INDICATIONS: Obesity  RESULTS:  reactive COMMENTS: I personally handheld monitor.  Multiple episodes of fetal movement.  PLAN: 1. Continue fetal kick counts as directed. 2. Continue antepartum testing as scheduled.  Elonda Husky, M.D. 12/23/2017 8:58 AM

## 2017-12-23 NOTE — Progress Notes (Signed)
ROB-pt stated that her feet has been swollen for about 5 days and she think she is getting sick has a sore throat.

## 2017-12-30 ENCOUNTER — Encounter: Payer: Self-pay | Admitting: Obstetrics and Gynecology

## 2017-12-30 ENCOUNTER — Ambulatory Visit (INDEPENDENT_AMBULATORY_CARE_PROVIDER_SITE_OTHER): Payer: BC Managed Care – PPO | Admitting: Obstetrics and Gynecology

## 2017-12-30 ENCOUNTER — Other Ambulatory Visit: Payer: BC Managed Care – PPO

## 2017-12-30 ENCOUNTER — Encounter
Admission: RE | Admit: 2017-12-30 | Discharge: 2017-12-30 | Disposition: A | Discharge status: Home / Self Care | Payer: BC Managed Care – PPO | Source: Ambulatory Visit | Attending: Anesthesiology | Admitting: Anesthesiology

## 2017-12-30 VITALS — BP 129/67 | HR 81 | Wt 292.5 lb

## 2017-12-30 DIAGNOSIS — B951 Streptococcus, group B, as the cause of diseases classified elsewhere: Secondary | ICD-10-CM

## 2017-12-30 DIAGNOSIS — O0993 Supervision of high risk pregnancy, unspecified, third trimester: Secondary | ICD-10-CM

## 2017-12-30 DIAGNOSIS — Z98891 History of uterine scar from previous surgery: Secondary | ICD-10-CM

## 2017-12-30 LAB — POCT URINALYSIS DIPSTICK
Bilirubin, UA: NEGATIVE
GLUCOSE UA: NEGATIVE
Ketones, UA: NEGATIVE
LEUKOCYTES UA: NEGATIVE
Nitrite, UA: NEGATIVE
Protein, UA: POSITIVE — AB
RBC UA: NEGATIVE
SPEC GRAV UA: 1.015 (ref 1.010–1.025)
Urobilinogen, UA: 0.2 E.U./dL
pH, UA: 7 (ref 5.0–8.0)

## 2017-12-30 NOTE — Progress Notes (Signed)
ROB-pt stated that she is concerned about her feet and hands swelling. Pt stated that her hands swell to the point that she is not able to make a fist.  No other concerns.

## 2017-12-30 NOTE — Care Management (Signed)
Pt has dropped from BMI of 52 to 50. Back ok for spinal. Pt agreeable. OK to be done here.

## 2018-01-01 DIAGNOSIS — B951 Streptococcus, group B, as the cause of diseases classified elsewhere: Secondary | ICD-10-CM | POA: Insufficient documentation

## 2018-01-01 NOTE — Progress Notes (Signed)
ROB: Patient currently complains of swelling in hands and feet. Discussed compression stockings, elevation of legs in evenings.  Discussed C-section further, requested a midwife to be present if possible as she had to transfer her care so late in pregnancy. Will coordinate. Reviewed labs, GBS+. NST performed today was reviewed and was found to be reactive.  Continue recommended antenatal testing and prenatal care. RTC in 1 week.     NONSTRESS TEST INTERPRETATION  INDICATIONS: Obesity (morbid)  FHR baseline: 140 bpm RESULTS:Reactive COMMENTS: No contractions   PLAN: 1. Continue fetal kick counts twice a day. 2. Continue antepartum testing as scheduled-weekly

## 2018-01-06 ENCOUNTER — Ambulatory Visit (INDEPENDENT_AMBULATORY_CARE_PROVIDER_SITE_OTHER): Payer: BC Managed Care – PPO | Admitting: Obstetrics and Gynecology

## 2018-01-06 ENCOUNTER — Encounter: Payer: Self-pay | Admitting: Obstetrics and Gynecology

## 2018-01-06 ENCOUNTER — Other Ambulatory Visit: Payer: BC Managed Care – PPO

## 2018-01-06 VITALS — BP 122/85 | HR 79 | Wt 296.3 lb

## 2018-01-06 DIAGNOSIS — O0993 Supervision of high risk pregnancy, unspecified, third trimester: Secondary | ICD-10-CM

## 2018-01-06 LAB — POCT URINALYSIS DIPSTICK
BILIRUBIN UA: NEGATIVE
GLUCOSE UA: NEGATIVE
Ketones, UA: NEGATIVE
Nitrite, UA: NEGATIVE
Protein, UA: POSITIVE — AB
RBC UA: NEGATIVE
Spec Grav, UA: 1.015 (ref 1.010–1.025)
Urobilinogen, UA: 0.2 E.U./dL
pH, UA: 7 (ref 5.0–8.0)

## 2018-01-06 NOTE — Progress Notes (Signed)
ROB:  Scheduled for repeat CD.  Denies problems.  NONSTRESS TEST INTERPRETATION  INDICATIONS: Obesity RESULTS:  reactive COMMENTS: Hand held  PLAN: 1. Continue fetal kick counts as directed. 2. Continue antepartum testing as scheduled.  Elonda Huskyavid J. Rajeev Escue, M.D. 01/06/2018 9:01 AM

## 2018-01-06 NOTE — Progress Notes (Signed)
ROB-pt stated that she is doing well. Pt has a burn on her abd area from were she was cooking. Pt stated that she has been putting neosporin on the area.

## 2018-01-07 ENCOUNTER — Other Ambulatory Visit: Payer: Self-pay

## 2018-01-07 ENCOUNTER — Encounter
Admission: RE | Admit: 2018-01-07 | Discharge: 2018-01-07 | Disposition: A | Discharge status: Home / Self Care | Payer: BC Managed Care – PPO | Source: Ambulatory Visit | Attending: Obstetrics and Gynecology | Admitting: Obstetrics and Gynecology

## 2018-01-07 ENCOUNTER — Encounter: Payer: Self-pay | Admitting: Anesthesiology

## 2018-01-07 DIAGNOSIS — Z01812 Encounter for preprocedural laboratory examination: Secondary | ICD-10-CM | POA: Diagnosis not present

## 2018-01-07 HISTORY — DX: Gastro-esophageal reflux disease without esophagitis: K21.9

## 2018-01-07 LAB — CBC
HCT: 30.8 % — ABNORMAL LOW (ref 35.0–47.0)
HEMOGLOBIN: 10.4 g/dL — AB (ref 12.0–16.0)
MCH: 28.5 pg (ref 26.0–34.0)
MCHC: 33.8 g/dL (ref 32.0–36.0)
MCV: 84.2 fL (ref 80.0–100.0)
Platelets: 173 10*3/uL (ref 150–440)
RBC: 3.65 MIL/uL — ABNORMAL LOW (ref 3.80–5.20)
RDW: 14.5 % (ref 11.5–14.5)
WBC: 9 10*3/uL (ref 3.6–11.0)

## 2018-01-07 LAB — BASIC METABOLIC PANEL
ANION GAP: 7 (ref 5–15)
BUN: 7 mg/dL (ref 6–20)
CHLORIDE: 109 mmol/L (ref 101–111)
CO2: 20 mmol/L — ABNORMAL LOW (ref 22–32)
Calcium: 8.8 mg/dL — ABNORMAL LOW (ref 8.9–10.3)
Creatinine, Ser: 0.31 mg/dL — ABNORMAL LOW (ref 0.44–1.00)
GFR calc non Af Amer: >60 mL/min (ref >60)
Glucose, Bld: 124 mg/dL — ABNORMAL HIGH (ref 65–99)
POTASSIUM: 3.7 mmol/L (ref 3.5–5.1)
SODIUM: 136 mmol/L (ref 135–145)

## 2018-01-07 LAB — TYPE AND SCREEN
ABO/RH(D): O POS
Antibody Screen: NEGATIVE
Extend sample reason: UNDETERMINED

## 2018-01-07 NOTE — Patient Instructions (Addendum)
Your procedure is scheduled on: Monday January 10, 2018 CHECK IN AT THE  EMERGENCY DEPARTMENT AT 5:30 AM    REMEMBER: Instructions that are not followed completely may result in serious medical risk, up to and including death; or upon the discretion of your surgeon and anesthesiologist your surgery may need to be rescheduled.  Do not eat food OR DRINK  after midnight the night before your procedure.  No gum chewing, lozengers or hard candies.  No Alcohol for 24 hours before or after surgery.  No Smoking including e-cigarettes for 24 hours prior to surgery.  No chewable tobacco products for at least 6 hours prior to surgery.  No nicotine patches on the day of surgery.  On the morning of surgery brush your teeth with toothpaste and water, you may rinse your mouth with mouthwash if you wish. Do not swallow any toothpaste or mouthwash.  Notify your doctor if there is any change in your medical condition (cold, fever, infection).  Do not wear jewelry, make-up, hairpins, clips or nail polish.  Do not wear lotions, powders, or perfumes. You may wear deodorant.  Do not shave 48 hours prior to surgery. Men may shave face and neck.  Contacts and dentures may not be worn into surgery.  Do not bring valuables to the hospital, including drivers license, insurance or credit cards.  [facility] [facility] is not responsible for any belongings or valuables.   TAKE THESE MEDICATIONS THE MORNING OF SURGERY: NONE  Use CHG  or wipes as directed on instruction sheet.  Stop Anti-inflammatories (NSAIDS) such as Advil, Aleve, Ibuprofen, Motrin, Naproxen, Naprosyn and Aspirin based products such as Excedrin, Goodys Powder, BC Powder. (May take Tylenol or Acetaminophen if needed.)  Stop ANY OVER THE COUNTER supplements until after surgery. (May continue Vitamin D, Vitamin B, and multivitamin AND PRENATAL VITAMIN.)  Wear comfortable clothing (specific to your surgery type) to the hospital.  Plan for stool  softeners for home use.  If you are being admitted to the hospital overnight, leave your suitcase in the car. After surgery it may be brought to your room.  If you are being discharged the day of surgery, you will not be allowed to drive home. You will need a responsible adult to drive you home and stay with you that night.   If you are taking public transportation, you will need to have a responsible adult with you. Please confirm with your physician that it is acceptable to use public transportation.   Please call [phone] if you have any questions about these instructions.

## 2018-01-08 LAB — RPR: RPR Ser Ql: NONREACTIVE

## 2018-01-09 MED ORDER — DEXTROSE 5 % IV SOLN
3.0000 g | INTRAVENOUS | Status: AC
  Administered 2018-01-10: 3 g via INTRAVENOUS
  Filled 2018-01-09: qty 3

## 2018-01-10 ENCOUNTER — Inpatient Hospital Stay: Payer: BC Managed Care – PPO | Admitting: Anesthesiology

## 2018-01-10 ENCOUNTER — Other Ambulatory Visit: Payer: Self-pay

## 2018-01-10 ENCOUNTER — Inpatient Hospital Stay
Admission: RE | Admit: 2018-01-10 | Discharge: 2018-01-13 | DRG: 784 | Disposition: A | Discharge status: Home / Self Care | Payer: BC Managed Care – PPO | Attending: Obstetrics and Gynecology | Admitting: Obstetrics and Gynecology

## 2018-01-10 ENCOUNTER — Encounter: Payer: Self-pay | Admitting: Obstetrics and Gynecology

## 2018-01-10 ENCOUNTER — Encounter
Admission: RE | Disposition: A | Discharge status: Home / Self Care | Payer: Self-pay | Source: Home / Self Care | Attending: Obstetrics and Gynecology

## 2018-01-10 DIAGNOSIS — O99824 Streptococcus B carrier state complicating childbirth: Secondary | ICD-10-CM | POA: Diagnosis present

## 2018-01-10 DIAGNOSIS — O9081 Anemia of the puerperium: Secondary | ICD-10-CM | POA: Diagnosis not present

## 2018-01-10 DIAGNOSIS — O99214 Obesity complicating childbirth: Secondary | ICD-10-CM | POA: Diagnosis present

## 2018-01-10 DIAGNOSIS — Z3A39 39 weeks gestation of pregnancy: Secondary | ICD-10-CM

## 2018-01-10 DIAGNOSIS — O0993 Supervision of high risk pregnancy, unspecified, third trimester: Secondary | ICD-10-CM

## 2018-01-10 DIAGNOSIS — Z302 Encounter for sterilization: Secondary | ICD-10-CM | POA: Diagnosis not present

## 2018-01-10 DIAGNOSIS — D62 Acute posthemorrhagic anemia: Secondary | ICD-10-CM | POA: Diagnosis not present

## 2018-01-10 DIAGNOSIS — O34211 Maternal care for low transverse scar from previous cesarean delivery: Principal | ICD-10-CM | POA: Diagnosis present

## 2018-01-10 DIAGNOSIS — Z98891 History of uterine scar from previous surgery: Secondary | ICD-10-CM

## 2018-01-10 DIAGNOSIS — B951 Streptococcus, group B, as the cause of diseases classified elsewhere: Secondary | ICD-10-CM | POA: Diagnosis present

## 2018-01-10 DIAGNOSIS — Z6841 Body Mass Index (BMI) 40.0 and over, adult: Secondary | ICD-10-CM

## 2018-01-10 HISTORY — DX: Morbid (severe) obesity due to excess calories: E66.01

## 2018-01-10 LAB — ABO/RH: ABO/RH(D): O POS

## 2018-01-10 SURGERY — Surgical Case
Anesthesia: Spinal

## 2018-01-10 MED ORDER — PHENYLEPHRINE HCL 10 MG/ML IJ SOLN
INTRAMUSCULAR | Status: DC | PRN
  Administered 2018-01-10: 100 ug via INTRAVENOUS
  Administered 2018-01-10: 200 ug via INTRAVENOUS

## 2018-01-10 MED ORDER — LACTATED RINGERS IV SOLN
INTRAVENOUS | Status: DC
  Administered 2018-01-10: 08:00:00 via INTRAVENOUS
  Administered 2018-01-10: 1000 mL via INTRAVENOUS

## 2018-01-10 MED ORDER — ENOXAPARIN SODIUM 40 MG/0.4ML ~~LOC~~ SOLN
40.0000 mg | SUBCUTANEOUS | Status: DC
  Administered 2018-01-11 – 2018-01-13 (×3): 40 mg via SUBCUTANEOUS
  Filled 2018-01-10 (×3): qty 0.4

## 2018-01-10 MED ORDER — SODIUM CHLORIDE 0.9% FLUSH: 3.0000 mL | INTRAVENOUS | Status: DC | PRN

## 2018-01-10 MED ORDER — FENTANYL CITRATE (PF) 100 MCG/2ML IJ SOLN: 25.0000 ug | INTRAMUSCULAR | Status: DC | PRN

## 2018-01-10 MED ORDER — EPHEDRINE SULFATE 50 MG/ML IJ SOLN
INTRAMUSCULAR | Status: DC | PRN
  Administered 2018-01-10 (×2): 10 mg via INTRAVENOUS

## 2018-01-10 MED ORDER — ACETAMINOPHEN 500 MG PO TABS
1000.0000 mg | ORAL_TABLET | Freq: Four times a day (QID) | ORAL | Status: DC
  Administered 2018-01-10: 1000 mg via ORAL
  Filled 2018-01-10: qty 2

## 2018-01-10 MED ORDER — NALBUPHINE HCL 10 MG/ML IJ SOLN: 5.0000 mg | INTRAMUSCULAR | Status: DC | PRN

## 2018-01-10 MED ORDER — LACTATED RINGERS IV SOLN: INTRAVENOUS | Status: DC

## 2018-01-10 MED ORDER — PHENYLEPHRINE HCL 10 MG/ML IJ SOLN
INTRAMUSCULAR | Status: DC | PRN
  Administered 2018-01-10: 25 ug/min via INTRAVENOUS

## 2018-01-10 MED ORDER — LIDOCAINE 5 % EX PTCH
MEDICATED_PATCH | CUTANEOUS | Status: AC
  Filled 2018-01-10: qty 1

## 2018-01-10 MED ORDER — OXYCODONE-ACETAMINOPHEN 5-325 MG PO TABS
1.0000 | ORAL_TABLET | ORAL | Status: DC | PRN
  Administered 2018-01-11 – 2018-01-13 (×7): 1 via ORAL
  Filled 2018-01-10 (×7): qty 1

## 2018-01-10 MED ORDER — LACTATED RINGERS IV BOLUS
1000.0000 mL | Freq: Once | INTRAVENOUS | Status: AC
  Administered 2018-01-10: 1000 mL via INTRAVENOUS

## 2018-01-10 MED ORDER — MORPHINE SULFATE (PF) 0.5 MG/ML IJ SOLN
INTRAMUSCULAR | Status: DC | PRN
  Administered 2018-01-10: .15 mg via INTRATHECAL

## 2018-01-10 MED ORDER — OXYTOCIN 40 UNITS IN LACTATED RINGERS INFUSION - SIMPLE MED
INTRAVENOUS | Status: AC
  Filled 2018-01-10: qty 1000

## 2018-01-10 MED ORDER — KETOROLAC TROMETHAMINE 30 MG/ML IJ SOLN: 30.0000 mg | Freq: Four times a day (QID) | INTRAMUSCULAR | Status: DC | PRN

## 2018-01-10 MED ORDER — DIPHENHYDRAMINE HCL 25 MG PO CAPS: 25.0000 mg | ORAL_CAPSULE | Freq: Four times a day (QID) | ORAL | Status: DC | PRN

## 2018-01-10 MED ORDER — OXYCODONE-ACETAMINOPHEN 5-325 MG PO TABS: 2.0000 | ORAL_TABLET | ORAL | Status: DC | PRN

## 2018-01-10 MED ORDER — LIDOCAINE HCL (PF) 1 % IJ SOLN
INTRAMUSCULAR | Status: DC | PRN
  Administered 2018-01-10: 3 mL via SUBCUTANEOUS

## 2018-01-10 MED ORDER — ZOLPIDEM TARTRATE 5 MG PO TABS: 5.0000 mg | ORAL_TABLET | Freq: Every evening | ORAL | Status: DC | PRN

## 2018-01-10 MED ORDER — MAGNESIUM HYDROXIDE 400 MG/5ML PO SUSP: 30.0000 mL | ORAL | Status: DC | PRN

## 2018-01-10 MED ORDER — SENNOSIDES-DOCUSATE SODIUM 8.6-50 MG PO TABS
2.0000 | ORAL_TABLET | ORAL | Status: DC
  Administered 2018-01-11: 2 via ORAL
  Administered 2018-01-12: 1 via ORAL
  Administered 2018-01-13: 2 via ORAL
  Filled 2018-01-10 (×3): qty 2

## 2018-01-10 MED ORDER — ONDANSETRON HCL 4 MG/2ML IJ SOLN: 4.0000 mg | Freq: Three times a day (TID) | INTRAMUSCULAR | Status: DC | PRN

## 2018-01-10 MED ORDER — ONDANSETRON HCL 4 MG/2ML IJ SOLN
INTRAMUSCULAR | Status: DC | PRN
  Administered 2018-01-10: 4 mg via INTRAVENOUS

## 2018-01-10 MED ORDER — IBUPROFEN 600 MG PO TABS
600.0000 mg | ORAL_TABLET | Freq: Four times a day (QID) | ORAL | Status: DC
  Administered 2018-01-10 – 2018-01-13 (×10): 600 mg via ORAL
  Filled 2018-01-10 (×10): qty 1

## 2018-01-10 MED ORDER — GLYCOPYRROLATE 0.2 MG/ML IJ SOLN
INTRAMUSCULAR | Status: AC
  Filled 2018-01-10: qty 1

## 2018-01-10 MED ORDER — LIDOCAINE HCL (PF) 2 % IJ SOLN
INTRAMUSCULAR | Status: AC
  Filled 2018-01-10: qty 10

## 2018-01-10 MED ORDER — NALOXONE HCL 0.4 MG/ML IJ SOLN: 0.4000 mg | INTRAMUSCULAR | Status: DC | PRN

## 2018-01-10 MED ORDER — SOD CITRATE-CITRIC ACID 500-334 MG/5ML PO SOLN
30.0000 mL | ORAL | Status: AC
  Administered 2018-01-10: 30 mL via ORAL
  Filled 2018-01-10: qty 15

## 2018-01-10 MED ORDER — DEXAMETHASONE SODIUM PHOSPHATE 10 MG/ML IJ SOLN
INTRAMUSCULAR | Status: DC | PRN
  Administered 2018-01-10: 10 mg via INTRAVENOUS

## 2018-01-10 MED ORDER — ACETAMINOPHEN 325 MG PO TABS: 650.0000 mg | ORAL_TABLET | ORAL | Status: DC | PRN

## 2018-01-10 MED ORDER — PRENATAL MULTIVITAMIN CH
1.0000 | ORAL_TABLET | Freq: Every day | ORAL | Status: DC
  Administered 2018-01-11 – 2018-01-13 (×3): 1 via ORAL
  Filled 2018-01-10 (×3): qty 1

## 2018-01-10 MED ORDER — OXYTOCIN 40 UNITS IN LACTATED RINGERS INFUSION - SIMPLE MED
2.5000 [IU]/h | INTRAVENOUS | Status: AC
  Administered 2018-01-10: 2.5 [IU]/h via INTRAVENOUS

## 2018-01-10 MED ORDER — MENTHOL 3 MG MT LOZG
1.0000 | LOZENGE | OROMUCOSAL | Status: DC | PRN
  Filled 2018-01-10: qty 9

## 2018-01-10 MED ORDER — ONDANSETRON HCL 4 MG/2ML IJ SOLN: 4.0000 mg | Freq: Once | INTRAMUSCULAR | Status: DC | PRN

## 2018-01-10 MED ORDER — ROCURONIUM BROMIDE 50 MG/5ML IV SOLN
INTRAVENOUS | Status: AC
  Filled 2018-01-10: qty 1

## 2018-01-10 MED ORDER — COCONUT OIL OIL
1.0000 "application " | TOPICAL_OIL | Status: DC | PRN
  Filled 2018-01-10: qty 120

## 2018-01-10 MED ORDER — PHENYLEPHRINE HCL 10 MG/ML IJ SOLN
INTRAMUSCULAR | Status: AC
  Filled 2018-01-10: qty 1

## 2018-01-10 MED ORDER — LIDOCAINE 5 % EX PTCH
1.0000 | MEDICATED_PATCH | CUTANEOUS | Status: DC
  Administered 2018-01-11 – 2018-01-13 (×3): 1 via TRANSDERMAL
  Filled 2018-01-10 (×3): qty 1

## 2018-01-10 MED ORDER — SIMETHICONE 80 MG PO CHEW
80.0000 mg | CHEWABLE_TABLET | ORAL | Status: DC | PRN
  Administered 2018-01-10 – 2018-01-12 (×3): 80 mg via ORAL
  Filled 2018-01-10 (×3): qty 1

## 2018-01-10 MED ORDER — OXYTOCIN 40 UNITS IN LACTATED RINGERS INFUSION - SIMPLE MED
INTRAVENOUS | Status: DC | PRN
  Administered 2018-01-10: 1000 mL via INTRAVENOUS

## 2018-01-10 MED ORDER — DIBUCAINE 1 % RE OINT: 1.0000 "application " | TOPICAL_OINTMENT | RECTAL | Status: DC | PRN

## 2018-01-10 MED ORDER — SIMETHICONE 80 MG PO CHEW
80.0000 mg | CHEWABLE_TABLET | ORAL | Status: DC
  Administered 2018-01-11 – 2018-01-13 (×2): 80 mg via ORAL
  Filled 2018-01-10 (×2): qty 1

## 2018-01-10 MED ORDER — BUPIVACAINE IN DEXTROSE 0.75-8.25 % IT SOLN
INTRATHECAL | Status: DC | PRN
  Administered 2018-01-10: 1.6 mL via INTRATHECAL

## 2018-01-10 MED ORDER — MEPERIDINE HCL 25 MG/ML IJ SOLN: 6.2500 mg | INTRAMUSCULAR | Status: DC | PRN

## 2018-01-10 MED ORDER — LIDOCAINE 5 % EX PTCH
MEDICATED_PATCH | CUTANEOUS | Status: DC | PRN
  Administered 2018-01-10: 1 via TRANSDERMAL

## 2018-01-10 MED ORDER — DIPHENHYDRAMINE HCL 50 MG/ML IJ SOLN: 12.5000 mg | INTRAMUSCULAR | Status: DC | PRN

## 2018-01-10 MED ORDER — EPHEDRINE SULFATE 50 MG/ML IJ SOLN
INTRAMUSCULAR | Status: AC
  Filled 2018-01-10: qty 1

## 2018-01-10 MED ORDER — PROPOFOL 10 MG/ML IV BOLUS
INTRAVENOUS | Status: AC
  Filled 2018-01-10: qty 20

## 2018-01-10 MED ORDER — FERROUS SULFATE 325 (65 FE) MG PO TABS
325.0000 mg | ORAL_TABLET | Freq: Two times a day (BID) | ORAL | Status: DC
  Administered 2018-01-10 – 2018-01-13 (×6): 325 mg via ORAL
  Filled 2018-01-10 (×6): qty 1

## 2018-01-10 MED ORDER — KETOROLAC TROMETHAMINE 30 MG/ML IJ SOLN
30.0000 mg | Freq: Four times a day (QID) | INTRAMUSCULAR | Status: DC | PRN
  Administered 2018-01-10: 30 mg via INTRAVENOUS
  Filled 2018-01-10: qty 1

## 2018-01-10 MED ORDER — SUCCINYLCHOLINE CHLORIDE 20 MG/ML IJ SOLN
INTRAMUSCULAR | Status: AC
  Filled 2018-01-10: qty 1

## 2018-01-10 MED ORDER — OXYTOCIN 40 UNITS IN LACTATED RINGERS INFUSION - SIMPLE MED
INTRAVENOUS | Status: AC
  Administered 2018-01-10: 40 [IU]
  Filled 2018-01-10: qty 1000

## 2018-01-10 MED ORDER — WITCH HAZEL-GLYCERIN EX PADS: 1.0000 "application " | MEDICATED_PAD | CUTANEOUS | Status: DC | PRN

## 2018-01-10 MED ORDER — ONDANSETRON HCL 4 MG/2ML IJ SOLN
INTRAMUSCULAR | Status: AC
  Filled 2018-01-10: qty 2

## 2018-01-10 MED ORDER — DIPHENHYDRAMINE HCL 25 MG PO CAPS: 25.0000 mg | ORAL_CAPSULE | ORAL | Status: DC | PRN

## 2018-01-10 MED ORDER — MORPHINE SULFATE (PF) 0.5 MG/ML IJ SOLN
INTRAMUSCULAR | Status: AC
  Filled 2018-01-10: qty 10

## 2018-01-10 MED ORDER — GLYCOPYRROLATE 0.2 MG/ML IJ SOLN
INTRAMUSCULAR | Status: DC | PRN
  Administered 2018-01-10 (×2): 0.1 mg via INTRAVENOUS

## 2018-01-10 MED ORDER — DEXAMETHASONE SODIUM PHOSPHATE 10 MG/ML IJ SOLN
INTRAMUSCULAR | Status: AC
  Filled 2018-01-10: qty 1

## 2018-01-10 SURGICAL SUPPLY — 26 items
BAG COUNTER SPONGE EZ (MISCELLANEOUS) ×3 IMPLANT
CANISTER SUCT 3000ML PPV (MISCELLANEOUS) ×4 IMPLANT
CHLORAPREP W/TINT 26ML (MISCELLANEOUS) ×8 IMPLANT
COUNTER SPONGE BAG EZ (MISCELLANEOUS) ×1
DRSG TELFA 3X8 NADH (GAUZE/BANDAGES/DRESSINGS) ×4 IMPLANT
ELECT REM PT RETURN 9FT ADLT (ELECTROSURGICAL) ×4
ELECTRODE REM PT RTRN 9FT ADLT (ELECTROSURGICAL) ×2 IMPLANT
GAUZE SPONGE 4X4 12PLY STRL (GAUZE/BANDAGES/DRESSINGS) ×4 IMPLANT
GLOVE BIO SURGEON STRL SZ 6.5 (GLOVE) ×3 IMPLANT
GLOVE BIO SURGEONS STRL SZ 6.5 (GLOVE) ×1
GLOVE INDICATOR 7.0 STRL GRN (GLOVE) ×4 IMPLANT
GOWN STRL REUS W/ TWL LRG LVL3 (GOWN DISPOSABLE) ×4 IMPLANT
GOWN STRL REUS W/TWL LRG LVL3 (GOWN DISPOSABLE) ×4
KIT TURNOVER KIT A (KITS) ×4 IMPLANT
NS IRRIG 1000ML POUR BTL (IV SOLUTION) ×4 IMPLANT
PACK C SECTION AR (MISCELLANEOUS) ×4 IMPLANT
PAD OB MATERNITY 4.3X12.25 (PERSONAL CARE ITEMS) ×4 IMPLANT
PAD PREP 24X41 OB/GYN DISP (PERSONAL CARE ITEMS) ×4 IMPLANT
RETRACTOR TRAXI PANNICULUS (MISCELLANEOUS) ×2 IMPLANT
RTRCTR C-SECT PINK 25CM LRG (MISCELLANEOUS) ×4 IMPLANT
SUT MNCRL AB 4-0 PS2 18 (SUTURE) ×4 IMPLANT
SUT PLAIN 2 0 XLH (SUTURE) IMPLANT
SUT VIC AB 0 CT1 36 (SUTURE) ×16 IMPLANT
SUT VIC AB 3-0 SH 27 (SUTURE) ×2
SUT VIC AB 3-0 SH 27X BRD (SUTURE) ×2 IMPLANT
TRAXI PANNICULUS RETRACTOR (MISCELLANEOUS) ×2

## 2018-01-10 NOTE — Op Note (Addendum)
Cesarean Section Procedure Note  Indications: prior low transverse C-section x 1, patient declines vag del attempt  Pre-operative Diagnosis: 39 week 1 day pregnancy, morbid obesity (BMI 49), prior C-section x 1 declines TOLAC, desires permanent sterilization.  Post-operative Diagnosis: Same  Surgeon: Hildred Laser, MD  Assistants: Serafina Royals, CNM. No other capable assistant available, in surgery requiring high level assistant.   Procedure: Repeat low transverse Cesarean Section with Bilateral Tubal Ligation  Anesthesia: Spinal anesthesia  Procedure Details: The patient was seen in the Holding Room. The risks, benefits, complications, treatment options, and expected outcomes were discussed with the patient.  The patient concurred with the proposed plan, giving informed consent.  The site of surgery properly noted/marked. The patient was taken to the Operating Room, identified as Diana Mahoney and the procedure verified as C-Section Delivery with Bilateral Tubal Ligation.  A Time Out was held and the above information confirmed.  After induction of anesthesia, the patient was draped and prepped in the usual sterile manner. Anesthesia was tested and noted to be adequate. A Pfannenstiel incision was made and carried down through the subcutaneous tissue to the fascia. Fascial incision was made and extended transversely. The fascia was separated from the underlying rectus tissue superiorly and inferiorly. The rectus muscles were dissected down the midline using a scalpel. The peritoneum was identified and entered. Peritoneal incision was extended longitudinally. An Alexis retractor was placed in the abdomen. The utero-vesical peritoneal reflection was incised transversely and the bladder flap was bluntly freed from the lower uterine segment. A low transverse uterine incision was made. Delivered from cephalic presentation was a 3830 gram Female with Apgar scores of 8 at one minute and 8 at five  minutes. The surgical assist was able to adequately provide fundal pressures to allow for delivery of the infant . After the umbilical cord was clamped and cut, cord blood was obtained for evaluation. The placenta was removed intact and appeared normal. The uterus was exteriorized and cleared of all clots and debris. The uterine outline, tubes and ovaries appeared normal.  The uterine incision was closed with running locked sutures of 0-Vicryl.  Hemostasis was observed.  Attention was then turned to the fallopian tubes, and where the patient's right fallopian tube was identified and grasped with a Babcock clamp.  The tube was then followed out to the vimbria.  The Babcock clamp was then used to grasp the tube approximately 4 cm from the cornual region.  A 3 cm segment of tube was then ligated with a free tie of 0-Chromic using the [place] method and excised.  The left fallopian tube was then ligated in a similar fashion and excised. The tubal lumens were cauterized bilaterally.  Good hemostasis was noted with bilateral fallopian tubes. The uterus was then returned to the abdomen.   Lavage was carried out until clear. The Alexis retractor was removed from the abdomen. The peritoneum was reapproximated with 3-0 Vicryl in a running fashion. The fascia was then reapproximated with a running suture of 0-Vicryl. The subcutaneous fat layer was reapproximated with 2-0 Vicryl.  The skin was reapproximated with 4-0 Monocryl.  Instrument, sponge, and needle counts were correct prior the abdominal closure and at the conclusion of the case.   Findings: Female infant, cephalic presentation, 3830 grams, with Apgar scores of 8 at one minute and 8 at five minutes. Light meconium at amniotic rupture.  Intact placenta with 3 vessel cord.  The uterine outline, tubes and ovaries appeared normal.   Estimated  Blood Loss:  1000 ml      Drains: foley catheter to gravity drainage, 200 ml of clear urine at end of the  procedure         Total IV Fluids:  1150 ml  Specimens: Placenta and Disposition:  Sent to Pathology         Implants: None         Complications:  None; patient tolerated the procedure well.         Disposition: PACU - hemodynamically stable.         Condition: stable    Hildred Laserherry, Alondra Vandeven, MD [facility]

## 2018-01-10 NOTE — H&P (Signed)
Obstetric Preoperative History and Physical  Diana CollinsMaria N Mahoney is a 32 y.o. G2P1001 with IUP at 6359w1d presenting for presenting for scheduled repeat cesarean section with bilateral tubal ligation.  Patient with h/o prior C-section x 1, declined TOLAC.  Also with morbid obesity in pregnancy.   Prenatal Course Source of Care: [facility] with onset of care at 9 weeks Pregnancy complications or risks: Patient Active Problem List   Diagnosis Date Noted  . Group beta Strep positive 01/01/2018  . Morbid obesity (HCC) 12/09/2017  . Supervision of high risk pregnancy in third trimester 12/09/2017  . Previous cesarean section 11/01/2017  . BMI 45.0-49.9, adult (HCC) 11/01/2017  . Abnormal glucose tolerance affecting pregnancy, antepartum 11/01/2017   She plans to breastfeed She desires bilateral tubal ligation for postpartum contraception.   Prenatal labs and studies: ABO, Rh: --/--/O POS Performed at [facility], [address] [place], [place] [address]  [phone](06/10 0553) Antibody: NEG (06/07 0758) Rubella: 2.20 (11/15 1535) RPR: Non Reactive (06/07 0758)  HBsAg: Negative (11/15 1535)  HIV: Non Reactive (11/15 1535)  JXB:JYNWGNFAGBS:Positive (05/15 1653) 1 hr Glucola  Abnormal, with normal 3 hour GTT Genetic screening declined Anatomy US normal   OB History  Gravida Para Term Preterm AB Living  2 1          SAB TAB Ectopic Multiple Live Births               # Outcome Date GA Lbr Len/2nd Weight Sex Delivery Anes PTL Lv  2 Current           1 Para 2007    M CS-Unspec       Past Medical History:  Diagnosis Date  . GERD (gastroesophageal reflux disease)    with pregnancy  . Morbid obesity (HCC) 12/09/2017    Past Surgical History:  Procedure Laterality Date  . CESAREAN SECTION      History reviewed. No pertinent family history.   Social History   Socioeconomic History  . Marital status: Married    Spouse name: Not on file  . Number of children: Not on file  .  Years of education: Not on file  . Highest education level: Not on file  Occupational History  . Not on file  Social Needs  . Financial resource strain: Not on file  . Food insecurity:    Worry: Not on file    Inability: Not on file  . Transportation needs:    Medical: Not on file    Non-medical: Not on file  Tobacco Use  . Smoking status: Never Smoker  . Smokeless tobacco: Never Used  Substance and Sexual Activity  . Alcohol use: No    Frequency: Never  . Drug use: No  . Sexual activity: Yes    Partners: Male  Lifestyle  . Physical activity:    Days per week: Not on file    Minutes per session: Not on file  . Stress: Not on file  Relationships  . Social connections:    Talks on phone: Not on file    Gets together: Not on file    Attends religious service: Not on file    Active member of club or organization: Not on file    Attends meetings of clubs or organizations: Not on file    Relationship status: Not on file  Other Topics Concern  . Not on file  Social History Narrative  . Not on file     Medications Prior  to Admission  Medication Sig Dispense Refill Last Dose  . Prenatal Vit-Fe Fumarate-FA (PRENATAL MULTIVITAMIN) TABS tablet Take 1 tablet daily at 12 noon by mouth.   01/09/2018 at Unknown time    No Known Allergies  Review of Systems: Negative except for what is mentioned in HPI.  Physical Exam: BP 134/79 (BP Location: Left Arm)   Pulse 84   Temp 98.3 F (36.8 C) (Oral)   Resp 16   Ht 5\' 5"  (1.651 m)   Wt 296 lb (134.3 kg)   LMP 04/11/2017   SpO2 100%   BMI 49.26 kg/m  FHR by Doppler: 135 bpm GENERAL: Well-developed, well-nourished female in no acute distress.  LUNGS: Clear to auscultation bilaterally.  HEART: Regular rate and rhythm. ABDOMEN: Soft, nontender, nondistended, gravid, well-healed Pfannenstiel incision.  Large pannus.  PELVIC: Deferred EXTREMITIES: Nontender, no edema, 2+ distal pulses.   Pertinent Labs/Studies:   Results for  orders placed or performed during the hospital encounter of 01/10/18 (from the past 72 hour(s))  ABO/Rh     Status: None   Collection Time: 01/10/18  5:53 AM  Result Value Ref Range   ABO/RH(D)      O POS Performed at [facility], [address], [place], [place] [address]     Assessment and Plan :Diana Mahoney is a 32 y.o. G2P1 at [redacted]w[redacted]d being admitted  for scheduled repeat cesarean section delivery with bilateral tubal ligation . The patient is understanding of the planned procedure and is aware of and accepting of all surgical risks, including but not limited to: bleeding which may require transfusion or reoperation; infection which may require antibiotics; injury to bowel, bladder, ureters or other surrounding organs which may require repair; injury to the fetus; need for additional procedures including hysterectomy in the event of life-threatening complications; placental abnormalities wth subsequent pregnancies; incisional problems; blood clot disorders which may require blood thinners;, and other postoperative/anesthesia complications. Patient also desires permanent sterilization.  Other reversible forms of contraception were discussed with patient; she declines all other modalities. Risks of procedure discussed with patient including but not limited to: risk of regret, permanence of method, bleeding, infection, injury to surrounding organs and need for additional procedures.  Failure risk of about 1% with increased risk of ectopic gestation if pregnancy occurs was also discussed with patient.  Patient verbalized understanding of these risks and wants to proceed with sterilization. She gives informed written consent for the procedure. All questions have been answered.  To OR when ready.     Hildred Laser, MD [facility]

## 2018-01-10 NOTE — Anesthesia Preprocedure Evaluation (Signed)
Anesthesia Evaluation  Patient identified by MRN, date of birth, ID band Patient awake    Reviewed: Allergy & Precautions, NPO status , Patient's Chart, lab work & pertinent test results, reviewed documented beta blocker date and time   Airway Mallampati: III  TM Distance: >3 FB     Dental  (+) Chipped   Pulmonary           Cardiovascular      Neuro/Psych    GI/Hepatic GERD  Controlled,  Endo/Other    Renal/GU      Musculoskeletal   Abdominal   Peds  Hematology   Anesthesia Other Findings Obese.  Reproductive/Obstetrics                             Anesthesia Physical Anesthesia Plan  ASA: III  Anesthesia Plan: Spinal   Post-op Pain Management:    Induction:   PONV Risk Score and Plan:   Airway Management Planned:   Additional Equipment:   Intra-op Plan:   Post-operative Plan:   Informed Consent: I have reviewed the patients History and Physical, chart, labs and discussed the procedure including the risks, benefits and alternatives for the proposed anesthesia with the patient or authorized representative who has indicated his/her understanding and acceptance.     Plan Discussed with: CRNA  Anesthesia Plan Comments:         Anesthesia Quick Evaluation

## 2018-01-10 NOTE — Anesthesia Post-op Follow-up Note (Signed)
Anesthesia QCDR form completed.        

## 2018-01-10 NOTE — Transfer of Care (Signed)
Immediate Anesthesia Transfer of Care Note  Patient: Diana CollinsMaria N Mahoney  Procedure(s) Performed: REPEAT CESAREAN SECTION WITH BILATERAL TUBAL LIGATION  Patient Location: Mother/Baby  Anesthesia Type:Spinal  Level of Consciousness: awake, alert , oriented and patient cooperative  Airway & Oxygen Therapy: Patient Spontanous Breathing  Post-op Assessment: Report given to RN, Post -op Vital signs reviewed and stable and Patient moving all extremities  Post vital signs: Reviewed and stable  Last Vitals:  Vitals Value Taken Time  BP 121/59 01/10/2018  9:07 AM  Temp 36.5 C 01/10/2018  9:07 AM  Pulse 91 01/10/2018  9:07 AM  Resp 18 01/10/2018  9:07 AM  SpO2 100 % 01/10/2018  9:07 AM    Last Pain:  Vitals:   01/10/18 0907  TempSrc: Oral  PainSc: 0-No pain         Complications: No apparent anesthesia complications

## 2018-01-10 NOTE — Anesthesia Procedure Notes (Signed)
Spinal  Start time: 01/10/2018 7:28 AM End time: 01/10/2018 7:44 AM Staffing Anesthesiologist: Berdine Addisonhomas, Mathai, MD Resident/CRNA: Sherol DadeMacMang, Raeshawn Tafolla H, CRNA Performed: anesthesiologist  Preanesthetic Checklist Completed: patient identified, site marked, surgical consent, pre-op evaluation, timeout performed, IV checked, risks and benefits discussed and monitors and equipment checked Spinal Block Patient position: sitting Prep: ChloraPrep Patient monitoring: heart rate, continuous pulse ox and blood pressure Approach: midline Location: L3-4 Injection technique: single-shot Needle Needle type: Pencan  Needle gauge: 24 G Assessment Sensory level: T4 Additional Notes Timeout for anesthesia spinal done @0728 . CRNA attempted 2x without success. MD called to OR and successfully placed spinal @0744  as noted on flowsheet. Pt tolerated procedure well.

## 2018-01-10 NOTE — Lactation Note (Signed)
This note was copied from a baby's chart. Lactation Consultation Note  Patient Name: Diana Mahoney YNWGN'FToday's Date: 01/10/2018 Reason for consult: Follow-up assessment;Term;Difficult latch Assisted mom with positioning with pillows for comfort and placed baby in football hold.  Waked for feeding.  Fussy while trying to get latched.  Hand expressed colostrum into his mouth.  Mom has small breasts and nipples. Left nipple slightly inverts when stimulated.  After several attempts, he finally latched and took a few good rhythmic sucks.  Mom can feel a strong tug when he sucks.  He was having difficulty maintaining the latch coming on and off the breasts after a few sucks.  Once #20 nipple shield was placed on the breast, he was able to sustain the latch longer.  He remained on the breast for 45 minutes with approximately 10 minutes of nutritive sucking.     Maternal Data Formula Feeding for Exclusion: No Has patient been taught Hand Expression?: Yes Does the patient have breastfeeding experience prior to this delivery?: Yes  Feeding Feeding Type: Breast Fed Length of feed: 10 min(Remained skin to skin on breast for 45 minutes with 10 minutes of sucking Simultaneous filing. User may not have seen previous data.)  LATCH Score Latch: Repeated attempts needed to sustain latch, nipple held in mouth throughout feeding, stimulation needed to elicit sucking reflex.  Audible Swallowing: A few with stimulation(Hand expressed in mouth before placing nipple shield)  Type of Nipple: Everted at rest and after stimulation(Inverts slightly with stimulation)  Comfort (Breast/Nipple): Soft / non-tender  Hold (Positioning): Assistance needed to correctly position infant at breast and maintain latch.  LATCH Score: 7  Interventions Interventions: Assisted with latch;Adjust position;Support pillows;Position options;Breast massage;Hand express;Reverse pressure;Breast compression  Lactation Tools  Discussed/Used Tools: Nipple Shields Nipple shield size: 20 WIC Program: No   Consult Status Consult Status: Follow-up Date: 01/10/18 Follow-up type: Call as needed    [name], Leilanni Halvorson Kay 01/10/2018, 1:58 PM

## 2018-01-10 NOTE — Lactation Note (Signed)
This note was copied from a baby's chart. Lactation Consultation Note  Patient Name: Diana Mahoney Date: 01/10/2018 Reason for consult: Difficult latch;Term;Other (Comment)(Repeat cesarean section with BTL) Mom having to lie back due to low blood pressure issues.  Assisted mom in comfortable position with baby in biological cross cradle hold skin to skin.  Initially would root but not obtain latch on the breast not wanting to open mouth wide enough for deep latch and sucking in lower lip.  At first he would not even latch onto finger.  After several attempts, he took a few weak sucks on gloved finger.  Demonstrated hand expression of colostrum.  At first he was very fussy and gaggy spitting lots of mucous.  He began rooting with wide enough mouth so assisted with gently lowering chin and flipping out lip for him to get latch.  He took no more than a couple of weak sucks before falling back to sleep.  He refused to suck again with same results of fussing, spitting mucous, getting latch and then falling back to sleep.  Left him skin to skin with mom. Discussed normal course of lactation and routine newborn feeding patterns.  Informed mom that LC would write name and number on white board in room on mother baby unit for her to call if baby demonstrated any feeding/hunger cues and that we would continue to check on her periodically to assist with breast feeding.   Maternal Data Formula Feeding for Exclusion: No Has patient been taught Hand Expression?: Yes Does the patient have breastfeeding experience prior to this delivery?: Yes  Feeding Feeding Type: Breast Fed Length of feed: (Would finally latch, but would not suck)  LATCH Score Latch: Repeated attempts needed to sustain latch, nipple held in mouth throughout feeding, stimulation needed to elicit sucking reflex.  Audible Swallowing: None  Type of Nipple: Everted at rest and after stimulation  Comfort (Breast/Nipple): Soft /  non-tender  Hold (Positioning): Full assist, staff holds infant at breast  LATCH Score: 5  Interventions Interventions: Breast feeding basics reviewed;Adjust position;Assisted with latch;Support pillows;Skin to skin;Position options;Breast massage;Expressed milk;Hand express;Reverse pressure;Breast compression  Lactation Tools Discussed/Used WIC Program: No   Consult Status Consult Status: Follow-up Date: 01/10/18 Follow-up type: In-patient    [name], [name] 01/10/2018, 10:38 AM

## 2018-01-10 NOTE — Lactation Note (Signed)
This note was copied from a baby's chart. Lactation Consultation Note  Patient Name: Diana Ardeen JourdainMaria Christians ZOXWR'UToday's Date: 01/10/2018 Reason for consult: Follow-up assessment [name] (mom unsure about spelling for now) Lyn Hollingsheadlexander woke up showing hunger cues. Explained cues to mom and assisted with positioning him in biological cross cradle hold while mom was in inclined position.  Mom returned demonstration of applying nipple shield.  He latched with minimal assistance and began good rhythmic sucking with an occasional swallow.  Praised mom for her success with latching him to the breast.  He sucked intermittently for 30 minutes before starting to fall asleep and slipping off to tip of nipple shield.  Grandmother took him to burp.  Encouraged mom to call with questions, concerns or assistance.  Maternal Data Formula Feeding for Exclusion: No Has patient been taught Hand Expression?: Yes Does the patient have breastfeeding experience prior to this delivery?: Yes  Feeding Feeding Type: Breast Fed Length of feed: 30 min  LATCH Score Latch: Repeated attempts needed to sustain latch, nipple held in mouth throughout feeding, stimulation needed to elicit sucking reflex.  Audible Swallowing: A few with stimulation  Type of Nipple: Everted at rest and after stimulation(Everted at rest, but Slightly inverts when compressed)  Comfort (Breast/Nipple): Soft / non-tender  Hold (Positioning): Assistance needed to correctly position infant at breast and maintain latch.  LATCH Score: 7  Interventions Interventions: Adjust position;Assisted with latch;Support pillows;Position options;Breast massage  Lactation Tools Discussed/Used Tools: Nipple Shields Nipple shield size: 20 WIC Program: No   Consult Status Consult Status: PRN Date: 01/10/18 Follow-up type: Call as needed    [name], Duilio Heritage Kay 01/10/2018, 3:16 PM

## 2018-01-10 NOTE — Lactation Note (Signed)
This note was copied from a baby's chart. Lactation Consultation Note  Patient Name: Diana Ardeen JourdainMaria Klepacki WGNFA'OToday's Date: 01/10/2018 Reason for consult: Follow-up assessment;Term Assisted mom with latching baby to left breast in football hold without nipple shield for this breast feed.   Maternal Data Formula Feeding for Exclusion: No Has patient been taught Hand Expression?: Yes Does the patient have breastfeeding experience prior to this delivery?: Yes  Feeding Feeding Type: Breast Fed Length of feed: 18 min  LATCH Score Latch: Repeated attempts needed to sustain latch, nipple held in mouth throughout feeding, stimulation needed to elicit sucking reflex.  Audible Swallowing: A few with stimulation  Type of Nipple: Everted at rest and after stimulation(Everted at rest, but Slightly inverts when compressed)  Comfort (Breast/Nipple): Soft / non-tender  Hold (Positioning): Assistance needed to correctly position infant at breast and maintain latch.  LATCH Score: 7  Interventions Interventions: Adjust position;Assisted with latch;Support pillows;Position options;Breast massage  Lactation Tools Discussed/Used Tools: Nipple Shields Nipple shield size: 20 WIC Program: No   Consult Status Consult Status: PRN Follow-up type: Call as needed    [name], Chelsy Parrales Kay 01/10/2018, 5:15 PM

## 2018-01-11 ENCOUNTER — Encounter: Payer: Self-pay | Admitting: Obstetrics and Gynecology

## 2018-01-11 LAB — CBC
HCT: 25.7 % — ABNORMAL LOW (ref 35.0–47.0)
Hemoglobin: 8.4 g/dL — ABNORMAL LOW (ref 12.0–16.0)
MCH: 27.5 pg (ref 26.0–34.0)
MCHC: 32.6 g/dL (ref 32.0–36.0)
MCV: 84.5 fL (ref 80.0–100.0)
PLATELETS: 162 10*3/uL (ref 150–440)
RBC: 3.04 MIL/uL — AB (ref 3.80–5.20)
RDW: 14.3 % (ref 11.5–14.5)
WBC: 11.5 10*3/uL — ABNORMAL HIGH (ref 3.6–11.0)

## 2018-01-11 LAB — CREATININE, SERUM
CREATININE: 0.35 mg/dL — AB (ref 0.44–1.00)
GFR calc Af Amer: >60 mL/min (ref >60)

## 2018-01-11 LAB — SURGICAL PATHOLOGY

## 2018-01-11 NOTE — Anesthesia Post-op Follow-up Note (Signed)
  Anesthesia Pain Follow-up Note  Patient: Diana CollinsMaria N Mahoney  Day #: 1  Date of Follow-up: 01/11/2018 Time: 9:38 AM  Last Vitals:  Vitals:   01/11/18 0415 01/11/18 0746  BP: 102/64 109/73  Pulse: 82 77  Resp: 18 18  Temp: 36.6 C 36.6 C  SpO2: 100% 100%    Level of Consciousness: alert  Pain: none   Side Effects:None  Catheter Site Exam:clean, dry  Anti-Coag Meds (From admission, onward)   Start     Dose/Rate Route Frequency Ordered Stop   01/11/18 0800  enoxaparin (LOVENOX) injection 40 mg     40 mg Subcutaneous Every 24 hours 01/10/18 1207         Plan: D/C from anesthesia care at surgeon's request  [facility] Nadalie Laughner

## 2018-01-11 NOTE — Lactation Note (Signed)
This note was copied from a baby's chart. Lactation Consultation Note  Patient Name: Diana Ardeen JourdainMaria Partridge ZOXWR'UToday's Date: 01/11/2018 Reason for consult: Follow-up assessment   Maternal Data    Feeding Feeding Type: Breast Fed Length of feed: 10 min  LATCH Score Latch: Repeated attempts needed to sustain latch, nipple held in mouth throughout feeding, stimulation needed to elicit sucking reflex.  Audible Swallowing: A few with stimulation  Type of Nipple: Flat  Comfort (Breast/Nipple): Soft / non-tender  Hold (Positioning): Full assist, staff holds infant at breast  LATCH Score: 5  Interventions Interventions: Breast feeding basics reviewed;Assisted with latch;Skin to skin;Hand express;Pre-pump if needed  Lactation Tools Discussed/Used Tools: Pump;Nipple Dorris CarnesShields   Consult Status Consult Status: Follow-up Date: 01/11/18 Follow-up type: In-patient  Mom states she didn't have any changes of her breasts during pregnancy with size, shape, or tenderness. However, her areolas are now darker. Mom says 1st baby never latched and breasts didn't become swollen PP. LC wasn't able to get any colostrum out of mom's breasts using hand expression and reverse pressure. Implemented DEBP and mom pre-pumped for approx 10mins. Drops of colostrum came out. LC latched baby onto mom's breast using 24mm nipple shield and baby did a few audible swallows. F/u with 10mL of formula.   [name] 01/11/2018, 11:20 AM

## 2018-01-11 NOTE — Progress Notes (Signed)
Postpartum Day # 1: Cesarean Delivery (repeat) with BTL  Subjective: Patient reports tolerating PO, + flatus and no problems voiding. Pain is well controlled. Has not yet ambulated.   Objective: Vital signs in last 24 hours: Temp:  [97.7 F (36.5 C)-98.2 F (36.8 C)] 97.9 F (36.6 C) (06/11 0415) Pulse Rate:  [63-103] 82 (06/11 0415) Resp:  [15-18] 18 (06/11 0415) BP: (94-137)/(45-79) 102/64 (06/11 0415) SpO2:  [97 %-100 %] 100 % (06/11 0415)  Physical Exam:  General: alert and no distress Lungs: clear to auscultation bilaterally Breasts: normal appearance, no masses or tenderness Heart: regular rate and rhythm, S1, S2 normal, no murmur, click, rub or gallop Abdomen: soft, non-tender; bowel sounds normal; no masses,  no organomegaly Pelvis: Lochia appropriate, Uterine Fundus firm, Incision: healing well, no significant drainage, no dehiscence, no significant erythema Extremities: DVT Evaluation: No evidence of DVT seen on physical exam. Negative Homan's sign. No cords or calf tenderness. No significant calf/ankle edema.  CBC Latest Ref Rng & Units 01/11/2018 01/07/2018 10/29/2017  WBC 3.6 - 11.0 K/uL 11.5(H) 9.0 10.3  Hemoglobin 12.0 - 16.0 g/dL 1.6(X8.4(L) 10.4(L) 10.8(L)  Hematocrit 35.0 - 47.0 % 25.7(L) 30.8(L) 33.6(L)  Platelets 150 - 440 K/uL 162 173 216    Assessment/Plan: Status post Cesarean section. Doing well postoperatively.  Breastfeeding, s/p Lactation consult  Contraception BTL Advance to regular diet Continue PO pain management Remove foley catheter Discontinue IVF Encourage ambulation SCDs and Lovenox for DVT prophylaxis Continue current care. Anemia postpartum due to surgical blood loss, although previously also with anemia of pregnancy. Asymptomatic. Will treat with PO iron Plan for discharge in 1-2 days   Hildred LaserAnika Daiquan Resnik, MD [facility]

## 2018-01-11 NOTE — Anesthesia Postprocedure Evaluation (Cosign Needed)
Anesthesia Post Note  Patient: Diana CollinsMaria N Mahoney  Procedure(s) Performed: REPEAT CESAREAN SECTION WITH BILATERAL TUBAL LIGATION  Anesthesia Type: Spinal     Last Vitals:  Vitals:   01/11/18 0415 01/11/18 0746  BP: 102/64 109/73  Pulse: 82 77  Resp: 18 18  Temp: 36.6 C 36.6 C  SpO2: 100% 100%    Last Pain:  Vitals:   01/11/18 0800  TempSrc:   PainSc: 4                  Chiropodisttephanie Rossi Silvestro

## 2018-01-11 NOTE — Lactation Note (Signed)
This note was copied from a baby's chart. Lactation Consultation Note  Patient Name: Diana Ardeen JourdainMaria Perham WUJWJ'XToday's Date: 01/11/2018 Reason for consult: Follow-up assessment   Maternal Data    Feeding Feeding Type: Breast Milk  LATCH Score Latch: Grasps breast easily, tongue down, lips flanged, rhythmical sucking.  Audible Swallowing: A few with stimulation  Type of Nipple: Flat  Comfort (Breast/Nipple): Soft / non-tender  Hold (Positioning): Assistance needed to correctly position infant at breast and maintain latch.  LATCH Score: 7  Interventions    Lactation Tools Discussed/Used Tools: Nipple Shields Nipple shield size: 20   Consult Status Consult Status: Follow-up  Mom has been pumping q2/3 since 9am with little colostrum production. When baby is nursing, very few, if any swallows are heard and nothing in shield.   Mom is to continue pumping q2/3, nursing on demand no more than 5mins (unless swallows are heard...frequently), and follow up with no more than 25mL of formula.  Burnadette PeterJaniya M Madisin Hasan 01/11/2018, 3:59 PM

## 2018-01-12 NOTE — Progress Notes (Signed)
Postpartum Day # 2: Cesarean Delivery (repeat) with BTL  Subjective: Patient reports tolerating PO, + flatus, + BM and no problems voiding. Pain is well controlled. Ambulating without difficulty. Does note issues with breastfeeding (not producing much colostrum). Has been seen by lactation consultant.   Objective:  Vitals:   01/11/18 1205 01/11/18 1605 01/12/18 0005 01/12/18 0814  BP: 113/66 119/66 119/66 137/66  Pulse: 71 77 80 80  Resp: 18 18 18 20   Temp: 97.8 F (36.6 C) 98.5 F (36.9 C) 98.1 F (36.7 C) 98.4 F (36.9 C)  TempSrc: Oral Oral Oral Oral  SpO2: 100% 100% 100% 98%  Weight:      Height:        Physical Exam:  General: alert and no distress Lungs: clear to auscultation bilaterally Breasts: normal appearance, no masses or tenderness Heart: regular rate and rhythm, S1, S2 normal, no murmur, click, rub or gallop Abdomen: soft, non-tender; bowel sounds normal; no masses,  no organomegaly Pelvis: Lochia appropriate, Uterine Fundus firm, Incision: healing well, no significant drainage, no dehiscence, no significant erythema Extremities: DVT Evaluation: No evidence of DVT seen on physical exam. Negative Homan's sign. No cords or calf tenderness. No significant calf/ankle edema.  CBC Latest Ref Rng & Units 01/11/2018 01/07/2018 10/29/2017  WBC 3.6 - 11.0 K/uL 11.5(H) 9.0 10.3  Hemoglobin 12.0 - 16.0 g/dL 1.6(X8.4(L) 10.4(L) 10.8(L)  Hematocrit 35.0 - 47.0 % 25.7(L) 30.8(L) 33.6(L)  Platelets 150 - 440 K/uL 162 173 216    Assessment/Plan: Status post Cesarean section. Doing well postoperatively.  Breastfeeding, continue Lactation support Contraception BTL Regular diet Continue PO pain management Encourage ambulation SCDs and Lovenox for DVT prophylaxis when not ambulating Continue current care. Anemia postpartum due to surgical blood loss, although previously also with anemia of pregnancy. Asymptomatic. Continue to treat with PO iron Plan for discharge tomorrow.     Hildred LaserAnika Matti Killingsworth, MD [facility]

## 2018-01-12 NOTE — Lactation Note (Signed)
This note was copied from a baby's chart. Lactation Consultation Note  Patient Name: Diana Ardeen JourdainMaria [place]bay ZOXWR'UToday's Date: 01/12/2018 Reason for consult: Follow-up assessment   Mom has been doing  A lot of BF attempts and then pump and bottle feeds due to poor EBM and fussy at breast. I reviewed with her options of human donor milk (including what happens if supplement still needed after discharge home) and formula, as well as continuing with pump/bottle or to try SNS (pros/cons explained). Mom chose to breastfeed with donor milk at breast with SNS. RN warmed the donor milk. I set up the SNS and applied the nipple shield over it on right breast per Mom preference. Within some seconds, baby latched and developed a strong rhythmic suck/swallow pattern with SNS flow of donor milk. Mom started to tear up and smile that her baby was breastfeeding. She claims he feels stronger than the breast pump.   I will remind her that if SNS ever gets too cumbersome to use, that the back up plan is to hand express/pump 15 minutes and bottle feed baby at least 8 times in 24 hours. She was also informed that if donor milk ends up not being an option if long term supp is needed, then formula would be needed. Mom verbalized understadning.    Maternal Data    Feeding Feeding Type: Donor Breast Milk Nipple Type: Slow - flow Length of feed: 2 min  LATCH Score Latch: Grasps breast easily, tongue down, lips flanged, rhythmical sucking.(with NS)  Audible Swallowing: A few with stimulation(2 with SNS)  Type of Nipple: Everted at rest and after stimulation  Comfort (Breast/Nipple): Soft / non-tender  Hold (Positioning): Assistance needed to correctly position infant at breast and maintain latch.  LATCH Score: 8  Interventions Interventions: Assisted with latch;Skin to skin;Support pillows;Position options(donor milk in SNS at breast with 20 mm NS)  Lactation Tools Discussed/Used Tools: Nipple Shamikia Linskey Nipple shield  size: 20   Consult Status Consult Status: Follow-up Date: 01/13/18    Sunday CornSandra Clark Trissa Molina 01/12/2018, 2:54 PM

## 2018-01-13 MED ORDER — DOCUSATE SODIUM 100 MG PO CAPS: 100.0000 mg | ORAL_CAPSULE | Freq: Two times a day (BID) | ORAL | 2 refills | Status: DC | PRN

## 2018-01-13 MED ORDER — CITRANATAL 90 DHA 90-1 & 300 MG PO MISC: 1.0000 | Freq: Every day | ORAL | 2 refills | Status: DC

## 2018-01-13 MED ORDER — FERROUS SULFATE 325 (65 FE) MG PO TABS: 325.0000 mg | ORAL_TABLET | Freq: Two times a day (BID) | ORAL | 1 refills | Status: DC

## 2018-01-13 MED ORDER — OXYCODONE-ACETAMINOPHEN 5-325 MG PO TABS: 1.0000 | ORAL_TABLET | ORAL | 0 refills | Status: DC | PRN

## 2018-01-13 NOTE — Progress Notes (Signed)
D/C instructions provided verbal and written, pt states understanding of instructions and follow up appt.

## 2018-01-13 NOTE — Discharge Summary (Signed)
OB Discharge Summary     Patient Name: Diana Mahoney DOB: 02-05-1986 MRN: [id number]  Date of admission: 01/10/2018 Delivering MD: Hildred Laser   Date of discharge: 01/13/2018  Admitting diagnosis: PRIOR C SECTION Intrauterine pregnancy: [redacted]w[redacted]d     Secondary diagnosis:  Active Problems:   Previous cesarean section   BMI 45.0-49.9, adult [facility])   Supervision of high risk pregnancy in third trimester   Group beta Strep positive   S/P cesarean section  Additional problems: Anemia of pregnancy     Discharge diagnosis: Term Pregnancy Delivered, Anemia and Morbid obesity                                                                                                Post partum procedures:None  Augmentation: N/A  Complications: None  Hospital course:  Sceduled C/S   32 y.o. yo G2P1001 at [redacted]w[redacted]d was admitted to the hospital 01/10/2018 for scheduled repeat cesarean section with bilateral tubal ligation with the following indication:Elective Repeat, also desiring permanent sterlization.  Membrane Rupture Time/Date:   ,    Patient delivered a Viable infant.01/10/2018  Details of operation can be found in separate operative note.  Pateint had an uncomplicated postpartum course.  She is ambulating, tolerating a regular diet, passing flatus, and urinating well. Patient is discharged home in stable condition on  01/13/18         Physical exam  Vitals:   01/12/18 0005 01/12/18 0814 01/12/18 1554 01/12/18 2251  BP: 119/66 137/66 128/68 139/82  Pulse: 80 80 78 74  Resp: 18 20 18 18   Temp: 98.1 F (36.7 C) 98.4 F (36.9 C) 98 F (36.7 C) 98.6 F (37 C)  TempSrc: Oral Oral Oral Oral  SpO2: 100% 98%  99%  Weight:      Height:       General: alert and no distress Lochia: appropriate Uterine Fundus: firm Incision: Healing well with no significant drainage, No significant erythema, Dressing is clean, dry, and intact DVT Evaluation: No evidence of DVT seen on physical exam. Negative  Homan's sign. No cords or calf tenderness. No significant calf/ankle edema.  Labs: Lab Results  Component Value Date   WBC 11.5 (H) 01/11/2018   HGB 8.4 (L) 01/11/2018   HCT 25.7 (L) 01/11/2018   MCV 84.5 01/11/2018   PLT 162 01/11/2018   CMP Latest Ref Rng & Units 01/11/2018  Glucose 65 - 99 mg/dL -  BUN 6 - 20 mg/dL -  Creatinine 4.09 - 8.11 mg/dL 9.14(N)  Sodium 829 - 562 mmol/L -  Potassium 3.5 - 5.1 mmol/L -  Chloride 101 - 111 mmol/L -  CO2 22 - 32 mmol/L -  Calcium 8.9 - 10.3 mg/dL -    Discharge instruction: per After Visit Summary and "Baby and Me Booklet".  After visit meds:  Allergies as of 01/13/2018   No Known Allergies     Medication List    STOP taking these medications   prenatal multivitamin Tabs tablet     TAKE these medications   CITRANATAL 90 DHA 90-1 & 300 MG Misc Take 1  tablet by mouth daily.   docusate sodium 100 MG capsule Commonly known as:  COLACE Take 1 capsule (100 mg total) by mouth 2 (two) times daily as needed.   ferrous sulfate 325 (65 FE) MG tablet Take 1 tablet (325 mg total) by mouth 2 (two) times daily with a meal.   oxyCODONE-acetaminophen 5-325 MG tablet Commonly known as:  PERCOCET/ROXICET Take 1-2 tablets by mouth every 4 (four) hours as needed (pain scale 4-7).       Diet: routine diet  Activity: Advance as tolerated. Pelvic rest for 6 weeks.   Outpatient follow up:1 week for incision check Follow up Appt:No future appointments. Follow up Visit:No follow-ups on file.  Postpartum contraception: Tubal Ligation  Newborn Data: Live born female  Birth Weight: 8 lb 7.1 oz (3830 g) APGAR: 8, 8  Newborn Delivery   Birth date/time:  01/10/2018 08:18:00 Delivery type:  C-Section, Low Vertical Trial of labor:  No C-section categorization:  Repeat     Baby Feeding: breast and bottle Disposition:home with mother   01/13/2018 Hildred LaserAnika Dalton Molesworth, MD

## 2018-01-13 NOTE — Progress Notes (Signed)
Pt and family watching period of purple cry. 

## 2018-01-18 ENCOUNTER — Ambulatory Visit (INDEPENDENT_AMBULATORY_CARE_PROVIDER_SITE_OTHER): Payer: BC Managed Care – PPO | Admitting: Obstetrics and Gynecology

## 2018-01-18 ENCOUNTER — Encounter: Payer: Self-pay | Admitting: Obstetrics and Gynecology

## 2018-01-18 VITALS — BP 142/74 | HR 86 | Ht 64.0 in | Wt 281.2 lb

## 2018-01-18 DIAGNOSIS — Z98891 History of uterine scar from previous surgery: Secondary | ICD-10-CM

## 2018-01-18 DIAGNOSIS — T148XXA Other injury of unspecified body region, initial encounter: Secondary | ICD-10-CM

## 2018-01-18 DIAGNOSIS — Z4889 Encounter for other specified surgical aftercare: Secondary | ICD-10-CM

## 2018-01-18 NOTE — Progress Notes (Signed)
    OBSTETRICS/GYNECOLOGY POST-OPERATIVE CLINIC VISIT  Subjective:     Diana Mahoney is a 32 y.o. 102P1001 female who presents to the clinic 1 weeks status post repeat Cesarean section with BTL.  Eating a regular diet without difficulty. Bowel movements are normal, but with mild constipation, now taking a stool softener. Pain is not well controlled.  Medications being used: prescription NSAID's including ibuprofen (Motrin) and narcotic analgesics including oxycodone/acetaminophen (Percocet, Tylox).  The following portions of the patient's history were reviewed and updated as appropriate: allergies, current medications, past family history, past medical history, past social history, past surgical history and problem list.  Review of Systems A comprehensive review of systems was negative except for: Integument/breast: positive for bruising on left side of abdomen    Objective:    BP (!) 142/74   Pulse 86   Ht 5\' 4"  (1.626 m)   Wt 281 lb 3.2 oz (127.6 kg)   Breastfeeding? Yes   BMI 48.27 kg/m  General:  alert and no distress  Abdomen: soft, bowel sounds active, non-tender.  Bruising noted at left side, ~ 10 cm from incision site.   Incision:   healing well, no drainage, no erythema, no hernia, no seroma, no swelling, no dehiscence, incision well approximated    Pathology:   A. LEFT FALLOPIAN TUBE SEGMENT; STERILIZATION:  - FALLOPIAN TUBE SEGMENT WITH FULL CROSS SECTION OF THE LUMEN.  - DECIDUAL REACTION.   B. RIGHT FALLOPIAN TUBE SEGMENT; STERILIZATION:  - FALLOPIAN TUBE SEGMENT WITH FULL CROSS SECTION OF THE LUMEN.    Assessment:    Doing well postoperatively.  Bruising   Plan:   1. Continue any current medications. 2. Wound care discussed.  Discussed that bruising was likely due to Lovenox injections given during hospital stay.  Given reassurance, will resolve with no issues.  3. Operative findings reviewed. Pathology report discussed. 4. Activity restrictions: no  bending, stooping, or squatting, no lifting more than 10 pounds and pelvic rest x 5 weeks.  5. Anticipated return to work: 7 weeks. 6. Follow up: 5 weeks for postpartum visit    Hildred Laserherry, Katiya Fike, MD [facility]

## 2018-01-18 NOTE — Progress Notes (Signed)
Pt stated that she was doing well and her incisions are healing well and also has large bruise on left side.

## 2018-02-04 ENCOUNTER — Other Ambulatory Visit: Payer: Self-pay | Admitting: Obstetrics and Gynecology

## 2018-02-16 ENCOUNTER — Telehealth: Payer: Self-pay | Admitting: Obstetrics and Gynecology

## 2018-02-16 NOTE — Telephone Encounter (Signed)
Pt was called and pt stated that she is having back pain and abd cramps with bright red blood. Pt stated that she think it maybe her cycle but she never had cramps and another symptoms before. [facility] and she would receive a call back after speaking with AC.

## 2018-02-16 NOTE — Telephone Encounter (Signed)
The patient called and stated that she would like to speak with a nurse if possible sometime today. No other information was disclosed. Please advise.  °

## 2018-02-16 NOTE — Telephone Encounter (Signed)
[facility] then take (2) 500 mg if the pain increases or no changes in 24 hours please call back to [facility]

## 2018-02-22 ENCOUNTER — Ambulatory Visit (INDEPENDENT_AMBULATORY_CARE_PROVIDER_SITE_OTHER): Payer: BC Managed Care – PPO | Admitting: Obstetrics and Gynecology

## 2018-02-22 ENCOUNTER — Encounter: Payer: Self-pay | Admitting: Obstetrics and Gynecology

## 2018-02-22 DIAGNOSIS — O9081 Anemia of the puerperium: Secondary | ICD-10-CM

## 2018-02-22 DIAGNOSIS — Z9851 Tubal ligation status: Secondary | ICD-10-CM

## 2018-02-22 DIAGNOSIS — O924 Hypogalactia: Secondary | ICD-10-CM

## 2018-02-22 NOTE — Progress Notes (Signed)
Pt is present today for PPV. Pt stated that she is breastfeeding some but is not producing a lot of milk. Pt stated that she has noticed small bumps(rash) all over both arms not anywhere else. Lower abd pain and back pain.

## 2018-02-22 NOTE — Progress Notes (Signed)
   OBSTETRICS POSTPARTUM CLINIC PROGRESS NOTE  Subjective:     Diana CollinsMaria N Mahoney is a 32 y.o. [id number] female who presents for a postpartum visit. She is 6 weeks postpartum following a low cervical transverse Cesarean section (repeat). I have fully reviewed the prenatal and intrapartum course. The delivery was at 39 gestational weeks.  Anesthesia: spinal. Postpartum course has been well. Baby's course has been well. Baby is feeding by both breast and formula. . Bleeding: patient hasresumed menses, with LMP 02/14/2018. Bowel function is normal. Bladder function is normal. Patient is not sexually active. Contraception method desired is tubal ligation. Postpartum depression screening: negative (EDPS = 2). Patient does report decreased   The following portions of the patient's history were reviewed and updated as appropriate: allergies, current medications, past family history, past medical history, past social history, past surgical history and problem list.  Review of Systems Integument/breast: positive for rash, negative for breast tenderness and nipple discharge   Objective:    BP (!) 142/82   Pulse 81   Ht 5\' 5"  (1.651 m)   Wt 268 lb 11.2 oz (121.9 kg)   Breastfeeding? Yes Comment: sometimes due lack of milk production  BMI 44.71 kg/m   General:  alert and no distress   Breasts:  inspection negative, no nipple discharge or bleeding, no masses or nodularity palpable  Lungs: clear to auscultation bilaterally  Heart:  regular rate and rhythm, S1, S2 normal, no murmur, click, rub or gallop  Abdomen: soft, non-tender; bowel sounds normal; no masses,  no organomegaly.  Well healed Pfannenstiel incision   Vulva:  normal  Vagina: normal vagina, no discharge, exudate, lesion, or erythema  Cervix:  no cervical motion tenderness and no lesions  Corpus: normal size, contour, position, consistency, mobility, non-tender  Adnexa:  normal adnexa and no mass, fullness, tenderness  Rectal Exam: Not  performed.  Skin: Macular rash noted on bilateral forearms, no erythema, almost eczematous in appearance         Labs:  Lab Results  Component Value Date   HGB 8.4 (L) 01/11/2018     Assessment:    Routine postpartum exam.   S/p Cesarean section Decreased milk production Anemia postpartum Rash  Plan:   1. Doing well postpartum.  Notes she is not concerned with decreased milk production as she will likely be transitioning to formula only once she returns to work.   2. Contraception: tubal ligation performed during C-section 3. Will check Hgb for h/o anemia. If returned to normal, can discontinue iron supplementation.  4. Rash, possible eczema or heat rash. Advised on Cetaphil or Aveeno cream, can also use OTC hydrocortisone if no relief of symptoms.  5. Follow up in: 3 months or as needed.    Hildred Laserherry, Texie Tupou, MD [facility]

## 2018-02-23 LAB — HEMOGLOBIN AND HEMATOCRIT, BLOOD
HEMATOCRIT: 31.3 % — AB (ref 34.0–46.6)
HEMOGLOBIN: 9.9 g/dL — AB (ref 11.1–15.9)

## 2018-03-15 ENCOUNTER — Other Ambulatory Visit: Payer: Self-pay | Admitting: Obstetrics and Gynecology

## 2018-03-17 ENCOUNTER — Other Ambulatory Visit: Payer: Self-pay

## 2018-03-18 ENCOUNTER — Other Ambulatory Visit: Payer: Self-pay

## 2018-05-27 ENCOUNTER — Encounter: Payer: BC Managed Care – PPO | Admitting: Certified Nurse Midwife

## 2018-06-02 ENCOUNTER — Ambulatory Visit (INDEPENDENT_AMBULATORY_CARE_PROVIDER_SITE_OTHER): Payer: BC Managed Care – PPO | Admitting: Certified Nurse Midwife

## 2018-06-02 ENCOUNTER — Encounter: Payer: Self-pay | Admitting: Certified Nurse Midwife

## 2018-06-02 ENCOUNTER — Other Ambulatory Visit (HOSPITAL_COMMUNITY)
Admission: RE | Admit: 2018-06-02 | Discharge: 2018-06-02 | Disposition: A | Discharge status: Home / Self Care | Payer: BC Managed Care – PPO | Source: Ambulatory Visit | Attending: Certified Nurse Midwife | Admitting: Certified Nurse Midwife

## 2018-06-02 VITALS — BP 105/72 | HR 64 | Ht 64.0 in | Wt 271.0 lb

## 2018-06-02 DIAGNOSIS — Z01419 Encounter for gynecological examination (general) (routine) without abnormal findings: Secondary | ICD-10-CM | POA: Diagnosis present

## 2018-06-02 DIAGNOSIS — L658 Other specified nonscarring hair loss: Secondary | ICD-10-CM

## 2018-06-02 DIAGNOSIS — Z124 Encounter for screening for malignant neoplasm of cervix: Secondary | ICD-10-CM | POA: Diagnosis not present

## 2018-06-02 DIAGNOSIS — N898 Other specified noninflammatory disorders of vagina: Secondary | ICD-10-CM | POA: Diagnosis not present

## 2018-06-02 DIAGNOSIS — N946 Dysmenorrhea, unspecified: Secondary | ICD-10-CM | POA: Diagnosis not present

## 2018-06-02 DIAGNOSIS — Z98891 History of uterine scar from previous surgery: Secondary | ICD-10-CM

## 2018-06-02 DIAGNOSIS — N930 Postcoital and contact bleeding: Secondary | ICD-10-CM

## 2018-06-02 NOTE — Patient Instructions (Signed)
Dysmenorrhea Dysmenorrhea means painful cramps during your period (menstrual period). You will have pain in your lower belly (abdomen). The pain is caused by the tightening (contracting) of the muscles of the womb (uterus). The pain may be mild or very bad. With this condition, you may:  Have a headache.  Feel sick to your stomach (nauseous).  Throw up (vomit).  Have lower back pain.  Follow these instructions at home: Helping pain and cramping  Put heat on your lower back or belly when you have pain or cramps. Use the heat source that your doctor tells you to use. ? Place a towel between your skin and the heat. ? Leave the heat on for 20-30 minutes. ? Remove the heat if your skin turns bright red. This is especially important if you cannot feel pain, heat, or cold. ? Do not have a heating pad on during sleep.  Do aerobic exercises. These include walking, swimming, or biking. These may help with cramps.  Massage your lower back or belly. This may help lessen pain. General instructions  Take over-the-counter and prescription medicines only as told by your doctor.  Do not drive or use heavy machinery while taking prescription pain medicine.  Avoid alcohol and caffeine during and right before your period. These can make cramps worse.  Do not use any products that have nicotine or tobacco. These include cigarettes and e-cigarettes. If you need help quitting, ask your doctor.  Keep all follow-up visits as told by your doctor. This is important. Contact a doctor if:  You have pain that gets worse.  You have pain that does not get better with medicine.  You have pain during sex.  You feel sick to your stomach or you throw up during your period, and medicine does not help. Get help right away if:  You pass out (faint). Summary  Dysmenorrhea means painful cramps during your period (menstrual period).  Put heat on your lower back or belly when you have pain or cramps.  Do  exercises like walking, swimming, or biking to help with cramps.  Contact a doctor if you have pain during sex. This information is not intended to replace advice given to you by your health care provider. Make sure you discuss any questions you have with your health care provider. Document Released: 10/16/2008 Document Revised: 08/06/2016 Document Reviewed: 08/06/2016 Elsevier Interactive Patient Education  2017 [place] 18-39 Years, Female Preventive care refers to lifestyle choices and visits with your health care provider that can promote health and wellness. What does preventive care include?  A yearly physical exam. This is also called an annual well check.  Dental exams once or twice a year.  Routine eye exams. Ask your health care provider how often you should have your eyes checked.  Personal lifestyle choices, including: ? Daily care of your teeth and gums. ? Regular physical activity. ? Eating a healthy diet. ? Avoiding tobacco and drug use. ? Limiting alcohol use. ? Practicing safe sex. ? Taking vitamin and mineral supplements as recommended by your health care provider. What happens during an annual well check? The services and screenings done by your health care provider during your annual well check will depend on your age, overall health, lifestyle risk factors, and family history of disease. Counseling Your health care provider may ask you questions about your:  Alcohol use.  Tobacco use.  Drug use.  Emotional well-being.  Home and relationship well-being.  Sexual activity.  Eating habits.  Work and work Statistician.  Method of birth control.  Menstrual cycle.  Pregnancy history.  Screening You may have the following tests or measurements:  Height, weight, and BMI.  Diabetes screening. This is done by checking your blood sugar (glucose) after you have not eaten for a while (fasting).  Blood pressure.  Lipid and  cholesterol levels. These may be checked every 5 years starting at age 87.  Skin check.  Hepatitis C blood test.  Hepatitis B blood test.  Sexually transmitted disease (STD) testing.  BRCA-related cancer screening. This may be done if you have a family history of breast, ovarian, tubal, or peritoneal cancers.  Pelvic exam and Pap test. This may be done every 3 years starting at age 71. Starting at age 61, this may be done every 5 years if you have a Pap test in combination with an HPV test.  Discuss your test results, treatment options, and if necessary, the need for more tests with your health care provider. Vaccines Your health care provider may recommend certain vaccines, such as:  Influenza vaccine. This is recommended every year.  Tetanus, diphtheria, and acellular pertussis (Tdap, Td) vaccine. You may need a Td booster every 10 years.  Varicella vaccine. You may need this if you have not been vaccinated.  HPV vaccine. If you are 54 or younger, you may need three doses over 6 months.  Measles, mumps, and rubella (MMR) vaccine. You may need at least one dose of MMR. You may also need a second dose.  Pneumococcal 13-valent conjugate (PCV13) vaccine. You may need this if you have certain conditions and were not previously vaccinated.  Pneumococcal polysaccharide (PPSV23) vaccine. You may need one or two doses if you smoke cigarettes or if you have certain conditions.  Meningococcal vaccine. One dose is recommended if you are age 45-21 years and a first-year college student living in a residence hall, or if you have one of several medical conditions. You may also need additional booster doses.  Hepatitis A vaccine. You may need this if you have certain conditions or if you travel or work in places where you may be exposed to hepatitis A.  Hepatitis B vaccine. You may need this if you have certain conditions or if you travel or work in places where you may be exposed to hepatitis  B.  Haemophilus influenzae type b (Hib) vaccine. You may need this if you have certain risk factors.  Talk to your health care provider about which screenings and vaccines you need and how often you need them. This information is not intended to replace advice given to you by your health care provider. Make sure you discuss any questions you have with your health care provider. Document Released: 09/15/2001 Document Revised: 04/08/2016 Document Reviewed: 05/21/2015 Elsevier Interactive Patient Education  [employer].

## 2018-06-02 NOTE — Progress Notes (Signed)
ANNUAL PREVENTATIVE CARE GYN  ENCOUNTER NOTE  Subjective:       Diana Mahoney is a 32 y.o. [phone] female here for a routine annual gynecologic exam.  Current complaints: 1. Needs Pap smear and annual labwork 2. Dysmenorrhea 3. Increased vaginal discharge 4. Post coital bleeding-recently 5. Hair loss  History significant for morbid obesity and previous cesarean section x 2.   Denies difficulty breathing or respiratory distress, chest pain, abdominal pain, excessive vaginal bleeding, dysuria, and leg pain or swelling.    Gynecologic History  Patient's last menstrual period was 05/16/2018 (exact date). Period Cycle (Days): 28 Period Duration (Days): 5 Period Pattern: Regular Menstrual Flow: Heavy, Light Menstrual Control: Tampon, Maxi pad Dysmenorrhea: (!) Severe Dysmenorrhea Symptoms: Cramping  Contraception: bilateral tubal ligation  Last Pap: 2018. Results were: normal  Obstetric History  OB History  Gravida Para Term Preterm AB Living  2 2 2     2   SAB TAB Ectopic Multiple Live Births        0 2    # Outcome Date GA Lbr Len/2nd Weight Sex Delivery Anes PTL Lv  2 Term 01/10/18 [redacted]w[redacted]d  8 lb 7.1 oz (3.83 kg) M CS-LVertical Spinal  LIV  1 Term 07/08/06    M CS-Unspec   LIV    Past Medical History:  Diagnosis Date  . GERD (gastroesophageal reflux disease)    with pregnancy  . Morbid obesity (HCC) 12/09/2017    Past Surgical History:  Procedure Laterality Date  . CESAREAN SECTION    . CESAREAN SECTION WITH BILATERAL TUBAL LIGATION  01/10/2018   Procedure: REPEAT CESAREAN SECTION WITH BILATERAL TUBAL LIGATION;  Surgeon: Hildred Laser, MD;  Location: [facility] ORS;  Service: Obstetrics;;  BIRTH [birth year] WEIGHT 8 lb 7 oz APGAR 8 / 8    Current Outpatient Medications on File Prior to Visit  Medication Sig Dispense Refill  . ibuprofen (ADVIL,MOTRIN) 600 MG tablet Take 600 mg by mouth every 6 (six) hours as needed.     No current facility-administered medications on file  prior to visit.     No Known Allergies  Social History   Socioeconomic History  . Marital status: Married    Spouse name: Not on file  . Number of children: Not on file  . Years of education: Not on file  . Highest education level: Not on file  Occupational History  . Not on file  Social Needs  . Financial resource strain: Not on file  . Food insecurity:    Worry: Not on file    Inability: Not on file  . Transportation needs:    Medical: Not on file    Non-medical: Not on file  Tobacco Use  . Smoking status: Never Smoker  . Smokeless tobacco: Never Used  Substance and Sexual Activity  . Alcohol use: No    Frequency: Never  . Drug use: No  . Sexual activity: Yes    Partners: Male    Birth control/protection: Surgical  Lifestyle  . Physical activity:    Days per week: Not on file    Minutes per session: Not on file  . Stress: Not on file  Relationships  . Social connections:    Talks on phone: Not on file    Gets together: Not on file    Attends religious service: Not on file    Active member of club or organization: Not on file    Attends meetings of clubs or organizations: Not on file  Relationship status: Not on file  . Intimate partner violence:    Fear of current or ex partner: Not on file    Emotionally abused: Not on file    Physically abused: Not on file    Forced sexual activity: Not on file  Other Topics Concern  . Not on file  Social History Narrative  . Not on file    Family History  Problem Relation Age of Onset  . Diabetes Mother   . Hypertension Mother   . Diabetes Father   . Breast cancer Neg Hx   . Ovarian cancer Neg Hx   . Colon cancer Neg Hx     The following portions of the patient's history were reviewed and updated as appropriate: allergies, current medications, past family history, past medical history, past social history, past surgical history and problem list.  Review of Systems  ROS negative except as noted above.  Information obtained from patient.    Objective:   BP 105/72   Pulse 64   Ht 5\' 4"  (1.626 m)   Wt 271 lb (122.9 kg)   LMP 05/16/2018 (Exact Date)   BMI 46.52 kg/m    CONSTITUTIONAL: Well-developed, well-nourished female in no acute distress.   PSYCHIATRIC: Normal mood and affect. Normal behavior. Normal judgment and thought content.  NEUROLGIC: Alert and oriented to person, place, and time. Normal muscle tone coordination. No cranial nerve deficit noted.  HENT:  Normocephalic, atraumatic, External right and left ear normal.   EYES: Conjunctivae and EOM are normal. Pupils are equal and round.   NECK: Normal range of motion, supple, no masses.  Normal thyroid.   SKIN: Skin is warm and dry. No rash noted. Not diaphoretic. No erythema. No pallor.  CARDIOVASCULAR: Normal heart rate noted, regular rhythm, no murmur.  RESPIRATORY: Clear to auscultation bilaterally. Effort and breath sounds normal, no problems with respiration noted.  BREASTS: Symmetric in size. No masses, skin changes, nipple drainage, or lymphadenopathy.  ABDOMEN: Soft, normal bowel sounds, no distention noted.  No tenderness, rebound or guarding. Obese.   PELVIC:  External Genitalia: Normal  Vagina: Normal  Cervix: Normal  Uterus: Normal  Adnexa: Normal  MUSCULOSKELETAL: Normal range of motion. No tenderness.  No cyanosis, clubbing, or edema.  2+ distal pulses.  LYMPHATIC: No Axillary, Supraclavicular, or Inguinal Adenopathy.  Assessment:   Annual gynecologic examination 32 y.o.   Contraception: bilateral tubal ligation  Obesity 2   Problem List Items Addressed This Visit      Other   Previous cesarean section   Morbid obesity (HCC)   Relevant Orders   CBC   Ferritin   Thyroid Panel With TSH   Lipid panel   Hemoglobin A1c   Comprehensive metabolic panel    Other Visit Diagnoses    Well woman exam    -  Primary   Relevant Orders   Cytology - PAP   CBC   Ferritin   Thyroid Panel With  TSH   Lipid panel   Hemoglobin A1c   Comprehensive metabolic panel   Screening for cervical cancer       Relevant Orders   Cytology - PAP   Dysmenorrhea       Vaginal discharge       PCB (post coital bleeding)       Female pattern hair loss       Relevant Orders   CBC   Ferritin      Plan:   Pap: Pap Co Test with VG panel  collected, see orders  Labs: See orders   Routine preventative health maintenance measures emphasized: Exercise/Diet/Weight control, Tobacco Warnings, Alcohol/Substance use risks and Stress Management; see AVS  Discussed options for management of dysmenorrhea including exercise, OTCs, herbs and vitamins; see AVS  Encouraged use of vaginal lubricant with intercourse, sample sent via snail mail  Referral to Pelvic Floor PT, see orders  Reviewed red flag symptoms and when to call.   RTC x 1 year for ANNUAL EXAM or sooner if needed.   Gunnar Bulla, CNM [facility], [facility]

## 2018-06-02 NOTE — Progress Notes (Signed)
AE, c/o severe cramping with menstrual periods and lower back pain before and during menstrual periods. Patient takes ibuprofen with partial relief.

## 2018-06-03 LAB — FERRITIN: Ferritin: 26 ng/mL (ref 15–150)

## 2018-06-03 LAB — COMPREHENSIVE METABOLIC PANEL
ALBUMIN: 4.2 g/dL (ref 3.5–5.5)
ALT: 11 IU/L (ref 0–32)
AST: 14 IU/L (ref 0–40)
Albumin/Globulin Ratio: 1.4 (ref 1.2–2.2)
Alkaline Phosphatase: 102 IU/L (ref 39–117)
BUN/Creatinine Ratio: 19 (ref 9–23)
BUN: 10 mg/dL (ref 6–20)
Bilirubin Total: 0.5 mg/dL (ref 0.0–1.2)
CALCIUM: 9.1 mg/dL (ref 8.7–10.2)
CO2: 22 mmol/L (ref 20–29)
CREATININE: 0.53 mg/dL — AB (ref 0.57–1.00)
Chloride: 103 mmol/L (ref 96–106)
GFR calc Af Amer: 147 mL/min/{1.73_m2} (ref >59)
GFR, EST NON AFRICAN AMERICAN: 128 mL/min/{1.73_m2} (ref >59)
GLUCOSE: 80 mg/dL (ref 65–99)
Globulin, Total: 2.9 g/dL (ref 1.5–4.5)
Potassium: 4.3 mmol/L (ref 3.5–5.2)
SODIUM: 140 mmol/L (ref 134–144)
Total Protein: 7.1 g/dL (ref 6.0–8.5)

## 2018-06-03 LAB — CBC
HEMATOCRIT: 36.3 % (ref 34.0–46.6)
HEMOGLOBIN: 11.2 g/dL (ref 11.1–15.9)
MCH: 25.5 pg — AB (ref 26.6–33.0)
MCHC: 30.9 g/dL — ABNORMAL LOW (ref 31.5–35.7)
MCV: 83 fL (ref 79–97)
Platelets: 266 10*3/uL (ref 150–450)
RBC: 4.4 x10E6/uL (ref 3.77–5.28)
RDW: 15 % (ref 12.3–15.4)
WBC: 10.2 10*3/uL (ref 3.4–10.8)

## 2018-06-03 LAB — THYROID PANEL WITH TSH
FREE THYROXINE INDEX: 2 (ref 1.2–4.9)
T3 UPTAKE RATIO: 27 % (ref 24–39)
T4, Total: 7.5 ug/dL (ref 4.5–12.0)
TSH: 2.44 u[IU]/mL (ref 0.450–4.500)

## 2018-06-03 LAB — LIPID PANEL
CHOLESTEROL TOTAL: 128 mg/dL (ref 100–199)
Chol/HDL Ratio: 3.1 ratio (ref 0.0–4.4)
HDL: 41 mg/dL (ref >39)
LDL Calculated: 70 mg/dL (ref 0–99)
TRIGLYCERIDES: 86 mg/dL (ref 0–149)
VLDL Cholesterol Cal: 17 mg/dL (ref 5–40)

## 2018-06-03 LAB — HEMOGLOBIN A1C
Est. average glucose Bld gHb Est-mCnc: 120 mg/dL
Hgb A1c MFr Bld: 5.8 % — ABNORMAL HIGH (ref 4.8–5.6)

## 2018-06-06 LAB — CYTOLOGY - PAP
Diagnosis: NEGATIVE
HPV (WINDOPATH): NOT DETECTED

## 2019-02-27 ENCOUNTER — Other Ambulatory Visit: Payer: Self-pay

## 2019-02-27 DIAGNOSIS — Z20822 Contact with and (suspected) exposure to covid-19: Secondary | ICD-10-CM

## 2019-03-02 LAB — NOVEL CORONAVIRUS, NAA: SARS-CoV-2, NAA: NOT DETECTED

## 2019-03-06 ENCOUNTER — Other Ambulatory Visit: Payer: Self-pay

## 2019-03-06 DIAGNOSIS — Z20822 Contact with and (suspected) exposure to covid-19: Secondary | ICD-10-CM

## 2019-03-07 LAB — NOVEL CORONAVIRUS, NAA: SARS-CoV-2, NAA: NOT DETECTED

## 2019-06-09 ENCOUNTER — Encounter: Payer: Self-pay | Admitting: Certified Nurse Midwife

## 2019-06-09 ENCOUNTER — Encounter: Payer: BC Managed Care – PPO | Admitting: Certified Nurse Midwife

## 2019-06-09 ENCOUNTER — Other Ambulatory Visit: Payer: Self-pay

## 2019-06-09 ENCOUNTER — Ambulatory Visit (INDEPENDENT_AMBULATORY_CARE_PROVIDER_SITE_OTHER): Payer: BC Managed Care – PPO | Admitting: Certified Nurse Midwife

## 2019-06-09 VITALS — BP 112/74 | HR 78 | Ht 64.0 in | Wt 283.8 lb

## 2019-06-09 DIAGNOSIS — Z01419 Encounter for gynecological examination (general) (routine) without abnormal findings: Secondary | ICD-10-CM | POA: Diagnosis not present

## 2019-06-09 DIAGNOSIS — R7303 Prediabetes: Secondary | ICD-10-CM | POA: Diagnosis not present

## 2019-06-09 DIAGNOSIS — Z6841 Body Mass Index (BMI) 40.0 and over, adult: Secondary | ICD-10-CM | POA: Diagnosis not present

## 2019-06-09 NOTE — Progress Notes (Signed)
ANNUAL PREVENTATIVE CARE GYN  ENCOUNTER NOTE  Subjective:       Diana Mahoney is a 33 y.o. [phone] female here for a routine annual gynecologic exam. No questions or concerns.   Had flu vaccine at work.   Denies difficulty breathing or respiratory distress, chest pain, abdominal pain, excessive vaginal bleeding, dysuria, and leg pain or swelling.    Gynecologic History  Patient's last menstrual period was 05/26/2019 (exact date). Period Cycle (Days): 28 Period Duration (Days): 5 Period Pattern: Regular Menstrual Flow: Heavy, Light Menstrual Control: Tampon, Maxi pad Dysmenorrhea: (!) Moderate Dysmenorrhea Symptoms: Cramping(lower back pain)  Contraception: bilateral tubal ligation  Last Pap: 06/02/2018. Results were: Negative/Negative  Obstetric History  OB History  Gravida Para Term Preterm AB Living  2 2 2     2   SAB TAB Ectopic Multiple Live Births        0 2    # Outcome Date GA Lbr Len/2nd Weight Sex Delivery Anes PTL Lv  2 Term 01/10/18 [redacted]w[redacted]d  8 lb 7.1 oz (3.83 kg) M CS-LVertical Spinal N LIV  1 Term 07/08/06   8 lb 4 oz (3.742 kg) M CS-LTranv Spinal N LIV     Complications: Failure to Progress in First Stage    Past Medical History:  Diagnosis Date  . GERD (gastroesophageal reflux disease)    with pregnancy  . Morbid obesity (HCC) 12/09/2017    Past Surgical History:  Procedure Laterality Date  . CESAREAN SECTION    . CESAREAN SECTION WITH BILATERAL TUBAL LIGATION  01/10/2018   Procedure: REPEAT CESAREAN SECTION WITH BILATERAL TUBAL LIGATION;  Surgeon: 03/12/2018, MD;  Location: [facility] ORS;  Service: Obstetrics;;  BIRTH [birth year] WEIGHT 8 lb 7 oz APGAR 8 / 8    No Known Allergies  Social History   Socioeconomic History  . Marital status: Married    Spouse name: Not on file  . Number of children: Not on file  . Years of education: Not on file  . Highest education level: Not on file  Occupational History  . Not on file  Social Needs  . Financial  resource strain: Not on file  . Food insecurity    Worry: Not on file    Inability: Not on file  . Transportation needs    Medical: Not on file    Non-medical: Not on file  Tobacco Use  . Smoking status: Never Smoker  . Smokeless tobacco: Never Used  Substance and Sexual Activity  . Alcohol use: No    Frequency: Never  . Drug use: No  . Sexual activity: Yes    Partners: Male    Birth control/protection: Surgical    Comment: Tubal ligation   Lifestyle  . Physical activity    Days per week: Not on file    Minutes per session: Not on file  . Stress: Not on file  Relationships  . Social Hildred Laser on phone: Not on file    Gets together: Not on file    Attends religious service: Not on file    Active member of club or organization: Not on file    Attends meetings of clubs or organizations: Not on file    Relationship status: Not on file  . Intimate partner violence    Fear of current or ex partner: Not on file    Emotionally abused: Not on file    Physically abused: Not on file    Forced sexual activity: Not on  file  Other Topics Concern  . Not on file  Social History Narrative  . Not on file    Family History  Problem Relation Age of Onset  . Diabetes Mother   . Hypertension Mother   . Diabetes Father   . Breast cancer Neg Hx   . Ovarian cancer Neg Hx   . Colon cancer Neg Hx     The following portions of the patient's history were reviewed and updated as appropriate: allergies, current medications, past family history, past medical history, past social history, past surgical history and problem list.  Review of Systems  ROS negative except as noted above. Information obtained from patient.    Objective:   BP 112/74   Pulse 78   Ht 5\' 4"  (1.626 m)   Wt 283 lb 12.8 oz (128.7 kg)   LMP 05/26/2019 (Exact Date)   Breastfeeding No   BMI 48.71 kg/m    CONSTITUTIONAL: Well-developed, well-nourished female in no acute distress.   PSYCHIATRIC: Normal  mood and affect. Normal behavior.  Normal judgment and thought content.  [place]: Alert and oriented to person, place, and time. Normal muscle tone coordination. No cranial nerve deficit noted.  HENT:  Normocephalic, atraumatic, External right and left ear normal.   EYES: Conjunctivae and EOM are normal. Pupils are equal and round.   NECK: Normal range of motion, supple, no masses.  Normal thyroid.   SKIN: Skin is warm and dry. No rash noted. Not diaphoretic. No erythema. No pallor.  CARDIOVASCULAR: Normal heart rate noted, regular rhythm, no murmur.  RESPIRATORY: Clear to auscultation bilaterally. Effort and breath sounds normal, no problems with respiration noted.  BREASTS: Symmetric in size. No masses, skin changes, nipple drainage, or lymphadenopathy.  ABDOMEN: Soft, normal bowel sounds, no distention noted.  No tenderness, rebound or guarding. Obese.   PELVIC:  External Genitalia: Normal  BUS: Normal  Vagina: Normal  Cervix: Normal  Uterus: Normal  Adnexa: Normal  MUSCULOSKELETAL: Normal range of motion. No tenderness.  No cyanosis, clubbing, or edema.  2+ distal pulses.  LYMPHATIC: No Axillary, Supraclavicular, or Inguinal Adenopathy.  Assessment:   Annual gynecologic examination 33 y.o.   Contraception: bilateral tubal ligation   Obesity 3   Problem List Items Addressed This Visit      Other   BMI 45.0-49.9, adult ([place])   Relevant Orders   CBC   Thyroid Panel With TSH   Lipid panel   Hemoglobin A1c   Comprehensive metabolic panel    Other Visit Diagnoses    Well woman exam    -  Primary   Relevant Orders   CBC   Thyroid Panel With TSH   Lipid panel   Hemoglobin A1c   Comprehensive metabolic panel   Prediabetes       Relevant Orders   Thyroid Panel With TSH   Hemoglobin A1c      Plan:   Pap: Not needed  Labs: See orders   Routine preventative health maintenance measures emphasized: Exercise/Diet/Weight control, Tobacco Warnings,  Alcohol/Substance use risks, Stress Management and Peer Pressure Issues; see AVS  Reviewed red flag symptoms and when to call  RTC x 1 year for ANNUAL EXAM or sooner if needed   Diona Fanti, CNM [facility], [facility] 06/09/19 9:29 AM

## 2019-06-09 NOTE — Patient Instructions (Signed)
Preventive Care 21-31 Years Old, Female Preventive care refers to visits with your health care provider and lifestyle choices that can promote health and wellness. This includes:  A yearly physical exam. This may also be called an annual well check.  Regular dental visits and eye exams.  Immunizations.  Screening for certain conditions.  Healthy lifestyle choices, such as eating a healthy diet, getting regular exercise, not using drugs or products that contain nicotine and tobacco, and limiting alcohol use. What can I expect for my preventive care visit? Physical exam Your health care provider will check your:  Height and weight. This may be used to calculate body mass index (BMI), which tells if you are at a healthy weight.  Heart rate and blood pressure.  Skin for abnormal spots. Counseling Your health care provider may ask you questions about your:  Alcohol, tobacco, and drug use.  Emotional well-being.  Home and relationship well-being.  Sexual activity.  Eating habits.  Work and work environment.  Method of birth control.  Menstrual cycle.  Pregnancy history. What immunizations do I need?  Influenza (flu) vaccine  This is recommended every year. Tetanus, diphtheria, and pertussis (Tdap) vaccine  You may need a Td booster every 10 years. Varicella (chickenpox) vaccine  You may need this if you have not been vaccinated. Human papillomavirus (HPV) vaccine  If recommended by your health care provider, you may need three doses over 6 months. Measles, mumps, and rubella (MMR) vaccine  You may need at least one dose of MMR. You may also need a second dose. Meningococcal conjugate (MenACWY) vaccine  One dose is recommended if you are age 19-21 years and a first-year college student living in a residence hall, or if you have one of several medical conditions. You may also need additional booster doses. Pneumococcal conjugate (PCV13) vaccine  You may need  this if you have certain conditions and were not previously vaccinated. Pneumococcal polysaccharide (PPSV23) vaccine  You may need one or two doses if you smoke cigarettes or if you have certain conditions. Hepatitis A vaccine  You may need this if you have certain conditions or if you travel or work in places where you may be exposed to hepatitis A. Hepatitis B vaccine  You may need this if you have certain conditions or if you travel or work in places where you may be exposed to hepatitis B. Haemophilus influenzae type b (Hib) vaccine  You may need this if you have certain conditions. You may receive vaccines as individual doses or as more than one vaccine together in one shot (combination vaccines). Talk with your health care provider about the risks and benefits of combination vaccines. What tests do I need?  Blood tests  Lipid and cholesterol levels. These may be checked every 5 years starting at age 20.  Hepatitis C test.  Hepatitis B test. Screening  Diabetes screening. This is done by checking your blood sugar (glucose) after you have not eaten for a while (fasting).  Sexually transmitted disease (STD) testing.  BRCA-related cancer screening. This may be done if you have a family history of breast, ovarian, tubal, or peritoneal cancers.  Pelvic exam and Pap test. This may be done every 3 years starting at age 21. Starting at age 30, this may be done every 5 years if you have a Pap test in combination with an HPV test. Talk with your health care provider about your test results, treatment options, and if necessary, the need for more tests.   Follow these instructions at home: Eating and drinking   Eat a diet that includes fresh fruits and vegetables, whole grains, lean protein, and low-fat dairy.  Take vitamin and mineral supplements as recommended by your health care provider.  Do not drink alcohol if: ? Your health care provider tells you not to drink. ? You are  pregnant, may be pregnant, or are planning to become pregnant.  If you drink alcohol: ? Limit how much you have to 0-1 drink a day. ? Be aware of how much alcohol is in your drink. In the [place] one drink equals one 12 oz bottle of beer (355 mL), one 5 oz glass of wine (148 mL), or one 1 oz glass of hard liquor (44 mL). Lifestyle  Take daily care of your teeth and gums.  Stay active. Exercise for at least 30 minutes on 5 or more days each week.  Do not use any products that contain nicotine or tobacco, such as cigarettes, e-cigarettes, and chewing tobacco. If you need help quitting, ask your health care provider.  If you are sexually active, practice safe sex. Use a condom or other form of birth control (contraception) in order to prevent pregnancy and STIs (sexually transmitted infections). If you plan to become pregnant, see your health care provider for a preconception visit. What's next?  Visit your health care provider once a year for a well check visit.  Ask your health care provider how often you should have your eyes and teeth checked.  Stay up to date on all vaccines. This information is not intended to replace advice given to you by your health care provider. Make sure you discuss any questions you have with your health care provider. Document Released: 09/15/2001 Document Revised: 03/31/2018 Document Reviewed: 03/31/2018 Elsevier Patient Education  2020 Elsevier Inc.  

## 2019-06-09 NOTE — Progress Notes (Signed)
Patient here for annual exam, no complaints.  

## 2019-06-10 ENCOUNTER — Other Ambulatory Visit: Payer: Self-pay | Admitting: *Deleted

## 2019-06-10 DIAGNOSIS — Z20822 Contact with and (suspected) exposure to covid-19: Secondary | ICD-10-CM

## 2019-06-10 LAB — LIPID PANEL
Chol/HDL Ratio: 3.3 ratio (ref 0.0–4.4)
Cholesterol, Total: 129 mg/dL (ref 100–199)
HDL: 39 mg/dL — ABNORMAL LOW (ref >39)
LDL Chol Calc (NIH): 77 mg/dL (ref 0–99)
Triglycerides: 62 mg/dL (ref 0–149)
VLDL Cholesterol Cal: 13 mg/dL (ref 5–40)

## 2019-06-10 LAB — COMPREHENSIVE METABOLIC PANEL
ALT: 11 IU/L (ref 0–32)
AST: 17 IU/L (ref 0–40)
Albumin/Globulin Ratio: 1.4 (ref 1.2–2.2)
Albumin: 4.1 g/dL (ref 3.8–4.8)
Alkaline Phosphatase: 112 IU/L (ref 39–117)
BUN/Creatinine Ratio: 21 (ref 9–23)
BUN: 12 mg/dL (ref 6–20)
Bilirubin Total: 0.3 mg/dL (ref 0.0–1.2)
CO2: 20 mmol/L (ref 20–29)
Calcium: 8.6 mg/dL — ABNORMAL LOW (ref 8.7–10.2)
Chloride: 104 mmol/L (ref 96–106)
Creatinine, Ser: 0.56 mg/dL — ABNORMAL LOW (ref 0.57–1.00)
GFR calc Af Amer: 144 mL/min/{1.73_m2} (ref >59)
GFR calc non Af Amer: 125 mL/min/{1.73_m2} (ref >59)
Globulin, Total: 3 g/dL (ref 1.5–4.5)
Glucose: 81 mg/dL (ref 65–99)
Potassium: 4.3 mmol/L (ref 3.5–5.2)
Sodium: 138 mmol/L (ref 134–144)
Total Protein: 7.1 g/dL (ref 6.0–8.5)

## 2019-06-10 LAB — CBC
Hematocrit: 37.1 % (ref 34.0–46.6)
Hemoglobin: 11.8 g/dL (ref 11.1–15.9)
MCH: 28 pg (ref 26.6–33.0)
MCHC: 31.8 g/dL (ref 31.5–35.7)
MCV: 88 fL (ref 79–97)
Platelets: 216 10*3/uL (ref 150–450)
RBC: 4.21 x10E6/uL (ref 3.77–5.28)
RDW: 13.2 % (ref 11.7–15.4)
WBC: 7.3 10*3/uL (ref 3.4–10.8)

## 2019-06-10 LAB — THYROID PANEL WITH TSH
Free Thyroxine Index: 1.6 (ref 1.2–4.9)
T3 Uptake Ratio: 26 % (ref 24–39)
T4, Total: 6.2 ug/dL (ref 4.5–12.0)
TSH: 2.64 u[IU]/mL (ref 0.450–4.500)

## 2019-06-10 LAB — HEMOGLOBIN A1C
Est. average glucose Bld gHb Est-mCnc: 123 mg/dL
Hgb A1c MFr Bld: 5.9 % — ABNORMAL HIGH (ref 4.8–5.6)

## 2019-06-12 LAB — NOVEL CORONAVIRUS, NAA: SARS-CoV-2, NAA: DETECTED — AB

## 2019-06-13 ENCOUNTER — Telehealth: Payer: Self-pay | Admitting: Internal Medicine

## 2019-06-13 ENCOUNTER — Telehealth: Payer: Self-pay | Admitting: Critical Care Medicine

## 2019-06-13 NOTE — Telephone Encounter (Signed)
I connected with this patient who is Covid positive from a November 7 testing event.  She has very mild symptoms.  She is at loss of taste and smell low-grade fever cough.  Her symptoms began November 6.  She understands to stay in isolation at least through November 17.  The patient was exposed to her brother who was Covid positive.  Her son actually has been ill longer than she has with paroxysms of cough.  He is positive and so also is her baby.  Dr. Doreene Burke will follow up with the pediatric cases which are her children

## 2019-06-13 NOTE — Telephone Encounter (Signed)
I connected with patient, discussed her positive result and that of her 31 years old son who currently has fever and cough. He was placed on Robitussin by his pediatrician. We discussed safety precautions, criteria for ED/UC visit and who to take him since she is also positive and had fever last night. They are all isolating, husband got tested yesterday, awaiting result. All questions answered, all concerns address. I gave her my direct office phone number to call in case of further questions.  Angelica Chessman, MD  6:27 PM 06/13/19

## 2019-06-20 ENCOUNTER — Encounter: Payer: Self-pay | Admitting: Emergency Medicine

## 2019-06-20 ENCOUNTER — Emergency Department: Payer: BC Managed Care – PPO

## 2019-06-20 ENCOUNTER — Emergency Department
Admission: EM | Admit: 2019-06-20 | Discharge: 2019-06-20 | Disposition: A | Discharge status: Home / Self Care | Payer: BC Managed Care – PPO | Attending: Emergency Medicine | Admitting: Emergency Medicine

## 2019-06-20 ENCOUNTER — Other Ambulatory Visit: Payer: Self-pay

## 2019-06-20 DIAGNOSIS — U071 COVID-19: Secondary | ICD-10-CM | POA: Diagnosis not present

## 2019-06-20 DIAGNOSIS — J1282 Pneumonia due to coronavirus disease 2019: Secondary | ICD-10-CM

## 2019-06-20 DIAGNOSIS — J1289 Other viral pneumonia: Secondary | ICD-10-CM | POA: Insufficient documentation

## 2019-06-20 DIAGNOSIS — R05 Cough: Secondary | ICD-10-CM | POA: Diagnosis present

## 2019-06-20 LAB — BASIC METABOLIC PANEL
Anion gap: 9 (ref 5–15)
BUN: 10 mg/dL (ref 6–20)
CO2: 24 mmol/L (ref 22–32)
Calcium: 8.8 mg/dL — ABNORMAL LOW (ref 8.9–10.3)
Chloride: 106 mmol/L (ref 98–111)
Creatinine, Ser: 0.5 mg/dL (ref 0.44–1.00)
GFR calc Af Amer: >60 mL/min (ref >60)
GFR calc non Af Amer: >60 mL/min (ref >60)
Glucose, Bld: 95 mg/dL (ref 70–99)
Potassium: 4.5 mmol/L (ref 3.5–5.1)
Sodium: 139 mmol/L (ref 135–145)

## 2019-06-20 LAB — CBC WITH DIFFERENTIAL/PLATELET
Abs Immature Granulocytes: 0.02 10*3/uL (ref 0.00–0.07)
Basophils Absolute: 0 10*3/uL (ref 0.0–0.1)
Basophils Relative: 0 %
Eosinophils Absolute: 0.1 10*3/uL (ref 0.0–0.5)
Eosinophils Relative: 2 %
HCT: 38.4 % (ref 36.0–46.0)
Hemoglobin: 12.4 g/dL (ref 12.0–15.0)
Immature Granulocytes: 0 %
Lymphocytes Relative: 27 %
Lymphs Abs: 1.7 10*3/uL (ref 0.7–4.0)
MCH: 27.4 pg (ref 26.0–34.0)
MCHC: 32.3 g/dL (ref 30.0–36.0)
MCV: 84.8 fL (ref 80.0–100.0)
Monocytes Absolute: 0.5 10*3/uL (ref 0.1–1.0)
Monocytes Relative: 7 %
Neutro Abs: 3.9 10*3/uL (ref 1.7–7.7)
Neutrophils Relative %: 64 %
Platelets: 229 10*3/uL (ref 150–400)
RBC: 4.53 MIL/uL (ref 3.87–5.11)
RDW: 13.8 % (ref 11.5–15.5)
WBC: 6.2 10*3/uL (ref 4.0–10.5)
nRBC: 0 % (ref 0.0–0.2)

## 2019-06-20 MED ORDER — PREDNISONE 20 MG PO TABS
60.0000 mg | ORAL_TABLET | Freq: Once | ORAL | Status: AC
  Administered 2019-06-20: 60 mg via ORAL
  Filled 2019-06-20: qty 3

## 2019-06-20 MED ORDER — AZITHROMYCIN 250 MG PO TABS: 250.0000 mg | ORAL_TABLET | Freq: Every day | ORAL | 0 refills | Status: AC

## 2019-06-20 MED ORDER — BENZONATATE 100 MG PO CAPS: ORAL_CAPSULE | ORAL | 0 refills | Status: DC

## 2019-06-20 MED ORDER — PREDNISONE 20 MG PO TABS: ORAL_TABLET | ORAL | 0 refills | Status: DC

## 2019-06-20 MED ORDER — ALBUTEROL SULFATE HFA 108 (90 BASE) MCG/ACT IN AERS: 2.0000 | INHALATION_SPRAY | Freq: Four times a day (QID) | RESPIRATORY_TRACT | 0 refills | Status: DC | PRN

## 2019-06-20 MED ORDER — AZITHROMYCIN 500 MG PO TABS
500.0000 mg | ORAL_TABLET | Freq: Once | ORAL | Status: AC
  Administered 2019-06-20: 500 mg via ORAL
  Filled 2019-06-20: qty 1

## 2019-06-20 NOTE — ED Triage Notes (Signed)
PT + covid x2wks ago, states cough is worsening. Pt states still eating and drinking normally. Denies SOB

## 2019-06-20 NOTE — ED Provider Notes (Signed)
[facility] Emergency Department Provider Note ____________________________________________  Time seen: 8144  I have reviewed the triage vital signs and the nursing notes.  HISTORY  Chief Complaint  Cough (+ covid)  HPI Diana Mahoney is a 33 y.o. female presented herself to the ED for evaluation of persistent, worsening cough after testing positive for Covid on 11/7.  Patient describes she still eating and drinking normally, she denies any shortness of breath, chest pain, or nausea.  She describes chest tightness and a nonproductive cough at this time.  She was advised by her primary provider to report to the ED for further evaluation of her symptoms.  She has been in quarantine since her symptoms developed on 11/7, and her test was reported on 11/10.   Past Medical History:  Diagnosis Date  . GERD (gastroesophageal reflux disease)    with pregnancy  . Morbid obesity ([place]) 12/09/2017    Patient Active Problem List   Diagnosis Date Noted  . Morbid obesity ([place]) 12/09/2017  . Previous cesarean section 11/01/2017  . BMI 45.0-49.9, adult ([place]) 11/01/2017    Past Surgical History:  Procedure Laterality Date  . CESAREAN SECTION    . CESAREAN SECTION WITH BILATERAL TUBAL LIGATION  01/10/2018   Procedure: REPEAT CESAREAN SECTION WITH BILATERAL TUBAL LIGATION;  Surgeon: Rubie Maid, MD;  Location: [facility] ORS;  Service: Obstetrics;;  BIRTH [birth year] WEIGHT 8 lb 7 oz APGAR 8 / 8    Prior to Admission medications   Medication Sig Start Date End Date Taking? Authorizing Provider  albuterol (VENTOLIN HFA) 108 (90 Base) MCG/ACT inhaler Inhale 2 puffs into the lungs every 6 (six) hours as needed. 06/20/19   Ulices Maack, Dannielle Karvonen, PA-C  azithromycin (ZITHROMAX Z-PAK) 250 MG tablet Take 1 tablet (250 mg total) by mouth daily for 4 days. 06/21/19 06/25/19  Riggins Cisek, Dannielle Karvonen, PA-C  benzonatate (TESSALON PERLES) 100 MG capsule Take 1-2 tabs TID prn cough 06/20/19    Jammy Plotkin, Dannielle Karvonen, PA-C  predniSONE (DELTASONE) 20 MG tablet Take 2 pills daily x 3 days; take 1 pill daily x 3 days; 0.5 tabs daily x 4 days 06/20/19   Kindel Rochefort, Dannielle Karvonen, PA-C    Allergies Patient has no known allergies.  Family History  Problem Relation Age of Onset  . Diabetes Mother   . Hypertension Mother   . Diabetes Father   . Breast cancer Neg Hx   . Ovarian cancer Neg Hx   . Colon cancer Neg Hx     Social History Social History   Tobacco Use  . Smoking status: Never Smoker  . Smokeless tobacco: Never Used  Substance Use Topics  . Alcohol use: No    Frequency: Never  . Drug use: No    Review of Systems  Constitutional: Negative for fever. Reports generalized fatigue and malaise Eyes: Negative for visual changes. ENT: Negative for sore throat. Cardiovascular: Negative for chest pain. Respiratory: Negative for shortness of breath. Reports cough as above. Gastrointestinal: Negative for abdominal pain, vomiting and diarrhea. Reports nausea Genitourinary: Negative for dysuria. Musculoskeletal: Negative for back pain. Skin: Negative for rash. Neurological: Negative for headaches, focal weakness or numbness. ____________________________________________  PHYSICAL EXAM:  VITAL SIGNS: ED Triage Vitals  Enc Vitals Group     BP 06/20/19 1335 (!) 146/87     Pulse Rate 06/20/19 1335 (!) 122     Resp 06/20/19 1335 20     Temp 06/20/19 1335 99.6 F (37.6 C)  Temp Source 06/20/19 1335 Oral     SpO2 06/20/19 1335 95 %     Weight 06/20/19 1335 283 lb (128.4 kg)     Height 06/20/19 1335 5\' 4"  (1.626 m)     Head Circumference --      Peak Flow --      Pain Score 06/20/19 1339 0     Pain Loc --      Pain Edu? --      Excl. in GC? --     Constitutional: Alert and oriented. Well appearing and in no distress. Head: Normocephalic and atraumatic. Eyes: Conjunctivae are normal. Normal extraocular movements Cardiovascular: Normal rate, regular rhythm.  Normal distal pulses. Respiratory: Normal respiratory effort. No wheezes/rales/rhonchi. Gastrointestinal: Soft and nontender. No distention. Musculoskeletal: Nontender with normal range of motion in all extremities.  Neurologic:  Normal gait without ataxia. Normal speech and language. No gross focal neurologic deficits are appreciated. Skin:  Skin is warm, dry and intact. No rash noted. Psychiatric: Mood and affect are normal. Patient exhibits appropriate insight and judgment. ____________________________________________   LABS (pertinent positives/negatives)  Labs Reviewed  BASIC METABOLIC PANEL - Abnormal; Notable for the following components:      Result Value   Calcium 8.8 (*)    All other components within normal limits  CBC WITH DIFFERENTIAL/PLATELET  ____________________________________________   RADIOLOGY  CXR  IMPRESSION: Multifocal pneumonia without frank consolidation. Suspect atypical organism pneumonia. No adenopathy. Heart size normal.  I, Xabi Wittler V Bacon-Nathasha Fiorillo, personally viewed and evaluated these images (plain radiographs) as part of my medical decision making, as well as reviewing the written report by the radiologist. ____________________________________________  PROCEDURES  Azithromycin 500 mg PO Prednisone 60 mg PO Procedures ____________________________________________  INITIAL IMPRESSION / ASSESSMENT AND PLAN / ED COURSE  Patient with ED evaluation of symptoms related to COVID-19.  Patient was tested on 11/7, and has been in quarantine since symptoms began.  She has noted persistent intermittently productive cough as well as nausea and diarrhea.  She presents today for evaluation of her ongoing cough.  Chest x-ray confirms changes consistent with an atypical pneumonia.  Patient is otherwise stable, without signs of acute respiratory distress or septic appearance.  Labs reveal no acute leukocytosis or other abnormalities.  She will be treated with initial  dose of azithromycin and prednisone in the ED.  Prescription for the same also provided along with albuterol inhaler and Tessalon Perles.  Patient will follow up with primary provider return to the ED for worsening symptoms as discussed.  Diana Mahoney was evaluated in Emergency Department on 06/20/2019 for the symptoms described in the history of present illness. She was evaluated in the context of the global COVID-19 pandemic, which necessitated consideration that the patient might be at risk for infection with the SARS-CoV-2 virus that causes COVID-19. Institutional protocols and algorithms that pertain to the evaluation of patients at risk for COVID-19 are in a state of rapid change based on information released by regulatory bodies including the CDC and federal and state organizations. These policies and algorithms were followed during the patient's care in the ED. ____________________________________________  FINAL CLINICAL IMPRESSION(S) / ED DIAGNOSES  Final diagnoses:  Pneumonia due to COVID-19 virus      06/22/2019, PA-C 06/20/19 1734    06/22/19, MD 06/20/19 1811

## 2019-06-20 NOTE — Discharge Instructions (Signed)
Your exam and labs are essentially normal. Your CXR does show the signs of COVID, an atypical pneumonia. You will be treated with steroids, antibiotics, cough medicines, and an inhaler. Take the medicines as directed. Continue to rest, hydrated, and remain under house quarantine until your symptoms improve. Follow-up with your provider or return to the ED for worsening symptoms as discussed.

## 2019-06-20 NOTE — ED Notes (Signed)
See triage note  Presents with constant cough  States she was positive for COVID about 2 weeks ago  Low grade feer on arrival

## 2019-06-28 ENCOUNTER — Encounter: Payer: Self-pay | Admitting: Certified Nurse Midwife

## 2019-06-28 ENCOUNTER — Other Ambulatory Visit: Payer: Self-pay

## 2019-06-28 DIAGNOSIS — R7303 Prediabetes: Secondary | ICD-10-CM

## 2019-06-28 DIAGNOSIS — Z6841 Body Mass Index (BMI) 40.0 and over, adult: Secondary | ICD-10-CM

## 2019-08-02 ENCOUNTER — Encounter: Payer: BC Managed Care – PPO | Attending: Certified Nurse Midwife | Admitting: Dietician

## 2019-08-02 ENCOUNTER — Encounter: Payer: Self-pay | Admitting: Dietician

## 2019-08-02 ENCOUNTER — Other Ambulatory Visit: Payer: Self-pay

## 2019-08-02 VITALS — Ht 64.0 in | Wt 284.4 lb

## 2019-08-02 DIAGNOSIS — Z6841 Body Mass Index (BMI) 40.0 and over, adult: Secondary | ICD-10-CM

## 2019-08-02 NOTE — Progress Notes (Signed)
Medical Nutrition Therapy: Visit start time: 0915  end time: 1015  Assessment:  Diagnosis: obesity Past medical history: none significant Psychosocial issues/ stress concerns: none  Preferred learning method:  . Auditory . Visual   Current weight: 248.8  Height: 5'4" Medications, supplements: reconciled list in medical record  Progress and evaluation:   Patient reports increase in weight since pregnancy with 54-month old child. She is currently back to pre-pregnancy weight, through working on smaller food portions.   Used [employer] products in the past, which worked until she stopped the shakes.   She reports bread is a favorite food.   Patient reports recent low HDL in lab results (39 on 06/09/19); seeks help in knowing how to improve this number.  Physical activity: none at this time; was walking for a while after delivering her baby, until she returned to work.  Dietary Intake:  Usual eating pattern includes 3 meals and 2 snacks per day. Dining out frequency: 6-8 meals per week.  Breakfast: coffee and bread or vanilla cookies; or cereal; biscuit (fast food) about once a week Snack: fruit-- banana or orange Lunch: 12-1pm-- takeout food ie chipotle bowl; sandwich at [employer] with water or unsweet tea; [place] Snack: fruit or yogurt; likes cashew, pecans but limits nuts due to rash when eating; occ chips Supper: 7pm-7:30-- cooks often-- beans, meat; sometimes takeout; steamed crab legs 12/29 Snack: none Beverages: water, unsweetened tea, coffee, occasional soda (used to drink much more often)  Nutrition Care Education: Topics covered:  Basic nutrition: basic food groups, appropriate nutrient balance, appropriate meal and snack schedule, general nutrition guidelines    Weight control: identifying healthy weight, importance of low sugar and low fat choices, portion control strategies including measuring, plate method, using low-carb veg to stretch portions and promote satiety,  estimated energy needs for weight loss at 1300-1500kcal, provided guidance for 45% CHO, 25% protein, and 30% fat Advanced nutrition: dining out Blood lipids:  Exercise as main factor in improving HDL, along with healthy fats   Nutritional Diagnosis:  [place]-3.3 Overweight/obesity As related to excess calories and inadequate physical activity.  As evidenced by patient with current BMI of 48.82, working on diet and lifestyle changes to promote weight loss.  Intervention:   Instruction and discussion as noted above.  Patient has begun working on portion control and limiting high-caloric density foods.   Established goals for further change, with direction from patient.   Education Materials given:  . Plate Planner with food lists, sample meal pattern . sample menus . Goals/ instructions   Learner/ who was taught:  . Patient    Level of understanding: Mar[place] Kitchen Verbalizes/ demonstrates competency   Demonstrated degree of understanding via:   Teach back Learning barriers: . None   Willingness to learn/ readiness for change: . Eager, change in progress   Monitoring and Evaluation:  Dietary intake, exercise, and body weight      follow up: 09/08/19 at 10:30am

## 2019-08-02 NOTE — Patient Instructions (Signed)
   Control food portions by using plate image -- 1/4 plate for protein, 1/4 plate for starch, and 1/2 plate for vegetables and fruits.  Plan for more meals at home vs takeout. Think of meal ideas using menus, and make sure to have ingredients on hand. Pre-prep by chopping veggies, pre-cook and then reheat meat, etc.

## 2019-09-08 ENCOUNTER — Ambulatory Visit: Payer: BC Managed Care – PPO | Admitting: Dietician

## 2019-10-04 ENCOUNTER — Encounter: Payer: Self-pay | Admitting: Dietician

## 2019-10-04 NOTE — Progress Notes (Signed)
Have not heard back from patient to reschedule her cancelled appointment from 09/08/19. Sent notification to referring provider.

## 2019-10-14 ENCOUNTER — Ambulatory Visit: Payer: BC Managed Care – PPO | Attending: Internal Medicine

## 2019-10-14 ENCOUNTER — Other Ambulatory Visit: Payer: Self-pay

## 2019-10-14 DIAGNOSIS — Z23 Encounter for immunization: Secondary | ICD-10-CM

## 2019-10-14 NOTE — Progress Notes (Addendum)
   Covid-19 Vaccination Clinic  Name:  Diana Mahoney    MRN: [id number] DOB: 02/02/1986  10/14/2019  [name] was observed post Covid-19 immunization for 30 minutes based on pre-vaccination screening without incident. She was provided with Vaccine Information Sheet and instruction to access the V-Safe system.   [name] was instructed to call 911 with any severe reactions post vaccine: Marland Kitchen Difficulty breathing  . Swelling of face and throat  . A fast heartbeat  . A bad rash all over body  . Dizziness and weakness   Immunizations Administered    Name Date Dose VIS Date Route   Pfizer COVID-19 Vaccine 10/14/2019  8:23 AM 0.3 mL 07/14/2019 Intramuscular   Manufacturer: [employer], [employer]   Lot: [id number]   NDC: [address]-1

## 2019-11-08 ENCOUNTER — Ambulatory Visit: Payer: BC Managed Care – PPO | Attending: Internal Medicine

## 2019-11-08 DIAGNOSIS — Z23 Encounter for immunization: Secondary | ICD-10-CM

## 2019-11-08 NOTE — Progress Notes (Signed)
   Covid-19 Vaccination Clinic  Name:  Diana Mahoney    MRN: [id number] DOB: May 02, 1986  11/08/2019  Diana Mahoney was observed post Covid-19 immunization for 15 minutes without incident. She was provided with Vaccine Information Sheet and instruction to access the V-Safe system.   Diana Mahoney was instructed to call 911 with any severe reactions post vaccine: Marland Kitchen Difficulty breathing  . Swelling of face and throat  . A fast heartbeat  . A bad rash all over body  . Dizziness and weakness   Immunizations Administered    Name Date Dose VIS Date Route   Pfizer COVID-19 Vaccine 11/08/2019  8:38 AM 0.3 mL 07/14/2019 Intramuscular   Manufacturer: [employer], [employer]   Lot: [id number]   NDC: [address]-7

## 2020-01-31 IMAGING — CR DG CHEST 2V
1 series · 2 of 2 positions shown · non-contrast
Comparison: None.

CLINICAL DATA: Cough.  Positive [id number]

EXAM:
CHEST - 2 VIEW

[Series 1: w chest pa · 0.14mm/px · 2 of 2 slices shown]
[im 1/2]
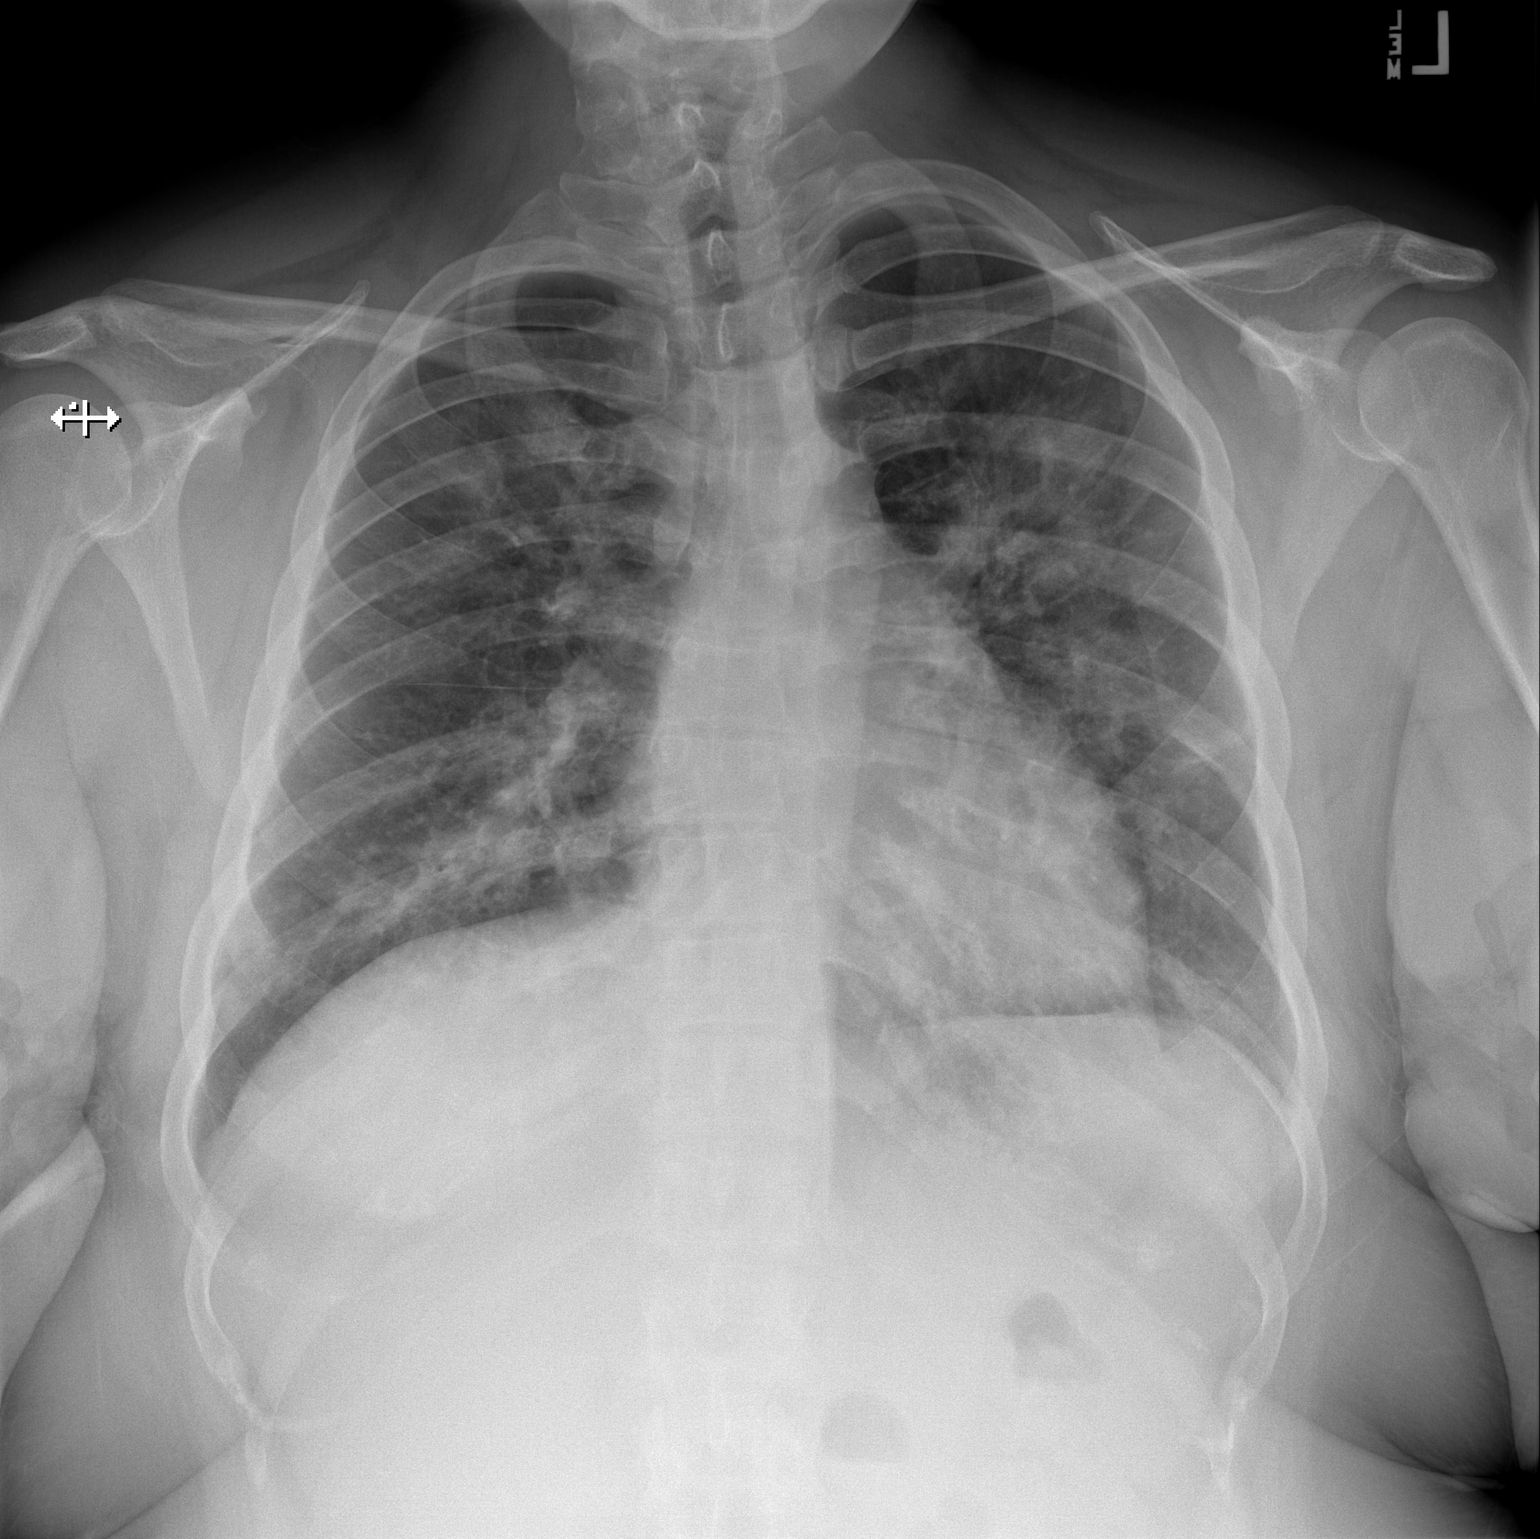
[im 2/2]
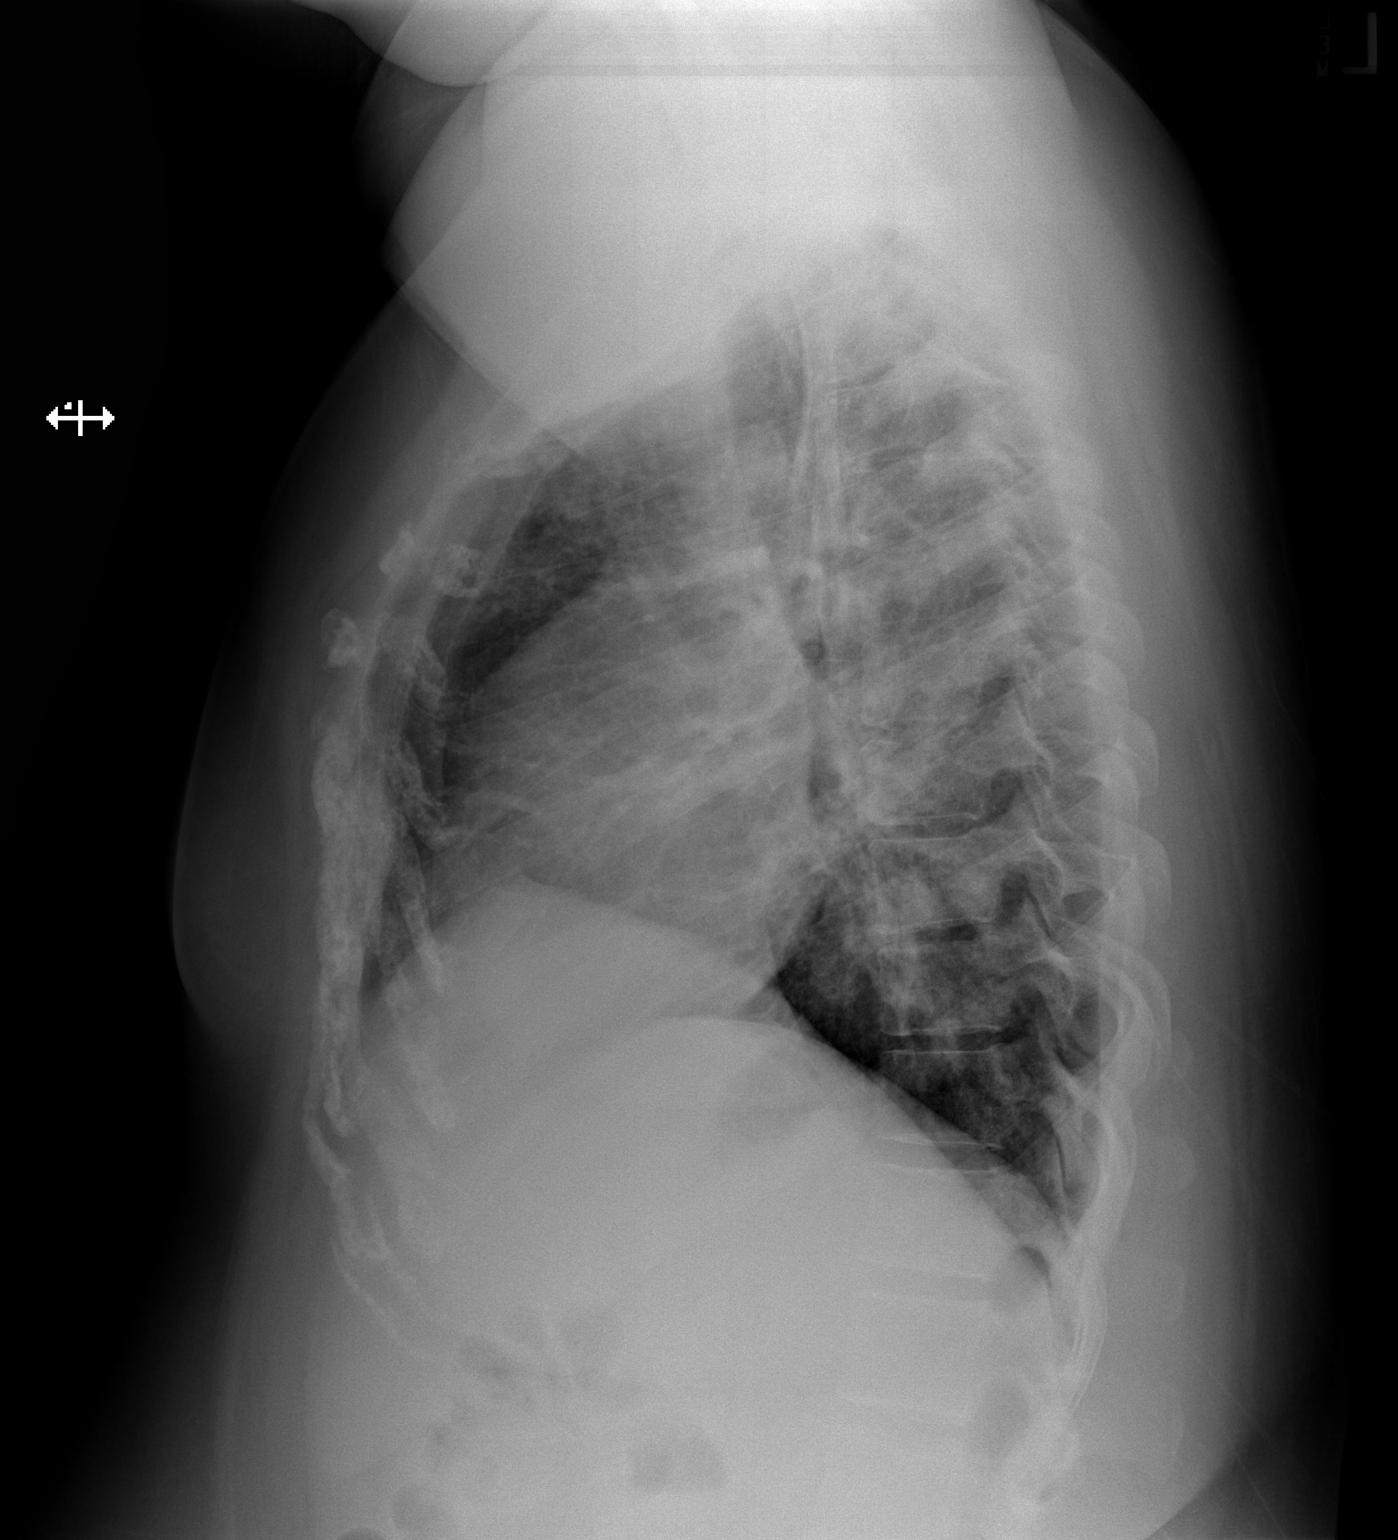

[2 of 2 positions shown; findings below may reference images not displayed]

FINDINGS: There is patchy airspace opacity in the left upper lobe and right
base regions. Equivocal airspace opacity is noted in the right upper
lobe. Heart size and pulmonary vascularity are normal. No
adenopathy. No bone lesions
IMPRESSION: Multifocal pneumonia without frank consolidation. Suspect atypical
organism pneumonia. No adenopathy. Heart size normal.

## 2020-06-14 ENCOUNTER — Encounter: Payer: BC Managed Care – PPO | Admitting: Certified Nurse Midwife

## 2020-08-01 ENCOUNTER — Encounter: Payer: Self-pay | Admitting: Certified Nurse Midwife

## 2020-08-01 ENCOUNTER — Telehealth: Payer: Self-pay

## 2020-08-01 ENCOUNTER — Other Ambulatory Visit: Payer: Self-pay

## 2020-08-01 ENCOUNTER — Ambulatory Visit (INDEPENDENT_AMBULATORY_CARE_PROVIDER_SITE_OTHER): Payer: BC Managed Care – PPO | Admitting: Certified Nurse Midwife

## 2020-08-01 VITALS — BP 125/94 | HR 81 | Ht 64.0 in | Wt 304.0 lb

## 2020-08-01 DIAGNOSIS — R7303 Prediabetes: Secondary | ICD-10-CM | POA: Diagnosis not present

## 2020-08-01 DIAGNOSIS — Z01419 Encounter for gynecological examination (general) (routine) without abnormal findings: Secondary | ICD-10-CM

## 2020-08-01 NOTE — Progress Notes (Signed)
I have seen, interviewed, and examined the patient in conjunction with the [employer] [facility] [employer] and affirm the diagnosis and management plan.   Gunnar Bulla, CNM [facility], [facility] 08/01/20 11:14 AM

## 2020-08-01 NOTE — Progress Notes (Signed)
ANNUAL PREVENTATIVE CARE GYN  ENCOUNTER NOTE  Subjective:    Diana Mahoney is a 34 y.o. [phone] female here for a routine annual gynecologic exam.    Denies difficulty breathing, respiratory distress, chest pain, abdominal pain, constipation or diarrhea, leg swelling or pain.   Gynecologic History  Patient's last menstrual period was 08/01/2020 (exact date).  Period Cycle (Days): 28 Period Duration (Days): 4-7 Period Pattern: Regular Menstrual Flow: Moderate,Heavy Menstrual Control: Tampon Menstrual Control Change Freq (Hours): 3 Dysmenorrhea: None Dysmenorrhea Symptoms: Cramping  Contraception: tubal ligation   Last Pap: 06/02/2018 . Results were: Neg/Neg   Obstetric History  OB History  Gravida Para Term Preterm AB Living  2 2 2     2   SAB IAB Ectopic Multiple Live Births        0 2    # Outcome Date GA Lbr Len/2nd Weight Sex Delivery Anes PTL Lv  2 Term 01/10/18 109w1d  8 lb 7.1 oz (3.83 kg) M CS-LVertical Spinal N LIV  1 Term 07/08/06   8 lb 4 oz (3.742 kg) M CS-LTranv Spinal N LIV     Complications: Failure to Progress in First Stage    Past Medical History:  Diagnosis Date  . GERD (gastroesophageal reflux disease)    with pregnancy  . Morbid obesity (HCC) 12/09/2017    Past Surgical History:  Procedure Laterality Date  . CESAREAN SECTION    . CESAREAN SECTION WITH BILATERAL TUBAL LIGATION  01/10/2018   Procedure: REPEAT CESAREAN SECTION WITH BILATERAL TUBAL LIGATION;  Surgeon: 03/12/2018, MD;  Location: [facility] ORS;  Service: Obstetrics;;  BIRTH [birth year] WEIGHT 8 lb 7 oz APGAR 8 / 8    Current Outpatient Medications on File Prior to Visit  Medication Sig Dispense Refill  . albuterol (VENTOLIN HFA) 108 (90 Base) MCG/ACT inhaler Inhale 2 puffs into the lungs every 6 (six) hours as needed. 6.7 g 0  . fluticasone (FLONASE) 50 MCG/ACT nasal spray SPRAY 2 SPRAYS INTO EACH NOSTRIL EVERY DAY     No current facility-administered medications on file prior to visit.     No Known Allergies  Social History   Socioeconomic History  . Marital status: Married    Spouse name: Not on file  . Number of children: Not on file  . Years of education: Not on file  . Highest education level: Not on file  Occupational History  . Not on file  Tobacco Use  . Smoking status: Never Smoker  . Smokeless tobacco: Never Used  Vaping Use  . Vaping Use: Never used  Substance and Sexual Activity  . Alcohol use: No  . Drug use: No  . Sexual activity: Yes    Partners: Male    Birth control/protection: Surgical    Comment: Tubal ligation   Other Topics Concern  . Not on file  Social History Narrative  . Not on file   Social Determinants of Health   Financial Resource Strain: Not on file  Food Insecurity: Not on file  Transportation Needs: Not on file  Physical Activity: Not on file  Stress: Not on file  Social Connections: Not on file  Intimate Partner Violence: Not on file    Family History  Problem Relation Age of Onset  . Diabetes Mother   . Hypertension Mother   . Diabetes Father   . Breast cancer Neg Hx   . Ovarian cancer Neg Hx   . Colon cancer Neg Hx     The following portions of  the patient's history were reviewed and updated as appropriate: allergies, current medications, past family history, past medical history, past social history, past surgical history and problem list.  Review of Systems  ROS- Negative other than what was reported above. Information obtained verbally from patient.   Objective:   BP (!) 125/94   Pulse 81   Ht 5\' 4"  (1.626 m)   Wt (!) 304 lb (137.9 kg)   LMP 08/01/2020 (Exact Date)   BMI 52.18 kg/m    CONSTITUTIONAL: Well-developed, well-nourished female in no acute distress.   PSYCHIATRIC: Normal mood and affect. Normal behavior. Normal judgment and thought content.  NEUROLGIC: Alert and oriented to person, place, and time. Normal muscle tone coordination. No cranial nerve deficit noted.  EYES:  Conjunctivae and EOM are normal.  NECK: Normal range of motion, supple, no masses.  Normal thyroid.  SKIN: Skin is warm and dry. No rash noted. Not diaphoretic. No erythema. No pallor.  CARDIOVASCULAR: Normal heart rate noted, regular rhythm, no murmur.  RESPIRATORY: Clear to auscultation bilaterally. Effort and breath sounds normal, no problems with respiration noted . BREASTS: Symmetric in size. No masses, skin changes, nipple drainage, or lymphadenopathy.  ABDOMEN: Soft, normal bowel sounds, no distention noted.  No tenderness, rebound or guarding. Obese.   PELVIC: Patient Declined   MUSCULOSKELETAL: Normal range of motion. No tenderness.  No cyanosis, clubbing, or edema.     Assessment:   Annual gynecologic examination 34 y.o.   Contraception: tubal ligation   Obesity 3   Problem List Items Addressed This Visit      Other   Morbid obesity (HCC)    Other Visit Diagnoses    Encounter for annual routine gynecological examination    -  Primary   Prediabetes          Plan:   Pap: Not needed   Labs: Patient declined   Routine preventative health maintenance measures emphasized: Exercise/Diet/Weight control, Tobacco Warnings, Alcohol/Substance use risks, Stress Management and Peer Pressure Issues; see AVS  Reviewed red flag symptoms and when to call.  RTC x 1 year for ANNUAL EXAM or sooner if needed.   34, CNM  [employer] 08/01/20 11:13 AM

## 2020-08-01 NOTE — Progress Notes (Signed)
Pt present for annual exam.  

## 2020-08-01 NOTE — Patient Instructions (Signed)
Preventive Care 20-32 Years Old, Female Preventive care refers to visits with your health care provider and lifestyle choices that can promote health and wellness. This includes:  A yearly physical exam. This may also be called an annual well check.  Regular dental visits and eye exams.  Immunizations.  Screening for certain conditions.  Healthy lifestyle choices, such as eating a healthy diet, getting regular exercise, not using drugs or products that contain nicotine and tobacco, and limiting alcohol use. What can I expect for my preventive care visit? Physical exam Your health care provider will check your:  Height and weight. This may be used to calculate body mass index (BMI), which tells if you are at a healthy weight.  Heart rate and blood pressure.  Skin for abnormal spots. Counseling Your health care provider may ask you questions about your:  Alcohol, tobacco, and drug use.  Emotional well-being.  Home and relationship well-being.  Sexual activity.  Eating habits.  Work and work Statistician.  Method of birth control.  Menstrual cycle.  Pregnancy history. What immunizations do I need?  Influenza (flu) vaccine  This is recommended every year. Tetanus, diphtheria, and pertussis (Tdap) vaccine  You may need a Td booster every 10 years. Varicella (chickenpox) vaccine  You may need this if you have not been vaccinated. Human papillomavirus (HPV) vaccine  If recommended by your health care provider, you may need three doses over 6 months. Measles, mumps, and rubella (MMR) vaccine  You may need at least one dose of MMR. You may also need a second dose. Meningococcal conjugate (MenACWY) vaccine  One dose is recommended if you are age 75-21 years and a first-year college student living in a residence hall, or if you have one of several medical conditions. You may also need additional booster doses. Pneumococcal conjugate (PCV13) vaccine  You may need  this if you have certain conditions and were not previously vaccinated. Pneumococcal polysaccharide (PPSV23) vaccine  You may need one or two doses if you smoke cigarettes or if you have certain conditions. Hepatitis A vaccine  You may need this if you have certain conditions or if you travel or work in places where you may be exposed to hepatitis A. Hepatitis B vaccine  You may need this if you have certain conditions or if you travel or work in places where you may be exposed to hepatitis B. Haemophilus influenzae type b (Hib) vaccine  You may need this if you have certain conditions. You may receive vaccines as individual doses or as more than one vaccine together in one shot (combination vaccines). Talk with your health care provider about the risks and benefits of combination vaccines. What tests do I need?  Blood tests  Lipid and cholesterol levels. These may be checked every 5 years starting at age 33.  Hepatitis C test.  Hepatitis B test. Screening  Diabetes screening. This is done by checking your blood sugar (glucose) after you have not eaten for a while (fasting).  Sexually transmitted disease (STD) testing.  BRCA-related cancer screening. This may be done if you have a family history of breast, ovarian, tubal, or peritoneal cancers.  Pelvic exam and Pap test. This may be done every 3 years starting at age 76. Starting at age 102, this may be done every 5 years if you have a Pap test in combination with an HPV test. Talk with your health care provider about your test results, treatment options, and if necessary, the need for more tests.  Follow these instructions at home: Eating and drinking   Eat a diet that includes fresh fruits and vegetables, whole grains, lean protein, and low-fat dairy.  Take vitamin and mineral supplements as recommended by your health care provider.  Do not drink alcohol if: ? Your health care provider tells you not to drink. ? You are  pregnant, may be pregnant, or are planning to become pregnant.  If you drink alcohol: ? Limit how much you have to 0-1 drink a day. ? Be aware of how much alcohol is in your drink. In the [place] one drink equals one 12 oz bottle of beer (355 mL), one 5 oz glass of wine (148 mL), or one 1 oz glass of hard liquor (44 mL). Lifestyle  Take daily care of your teeth and gums.  Stay active. Exercise for at least 30 minutes on 5 or more days each week.  Do not use any products that contain nicotine or tobacco, such as cigarettes, e-cigarettes, and chewing tobacco. If you need help quitting, ask your health care provider.  If you are sexually active, practice safe sex. Use a condom or other form of birth control (contraception) in order to prevent pregnancy and STIs (sexually transmitted infections). If you plan to become pregnant, see your health care provider for a preconception visit. What's next?  Visit your health care provider once a year for a well check visit.  Ask your health care provider how often you should have your eyes and teeth checked.  Stay up to date on all vaccines. This information is not intended to replace advice given to you by your health care provider. Make sure you discuss any questions you have with your health care provider. Document Revised: 03/31/2018 Document Reviewed: 03/31/2018 Elsevier Patient Education  2020 [employer].

## 2020-08-05 NOTE — Telephone Encounter (Signed)
Error

## 2021-08-08 ENCOUNTER — Encounter: Payer: BC Managed Care – PPO | Admitting: Certified Nurse Midwife

## 2022-01-22 ENCOUNTER — Emergency Department: Payer: BC Managed Care – PPO

## 2022-01-22 ENCOUNTER — Ambulatory Visit
Admission: EM | Admit: 2022-01-22 | Discharge: 2022-01-22 | Disposition: A | Discharge status: Home / Self Care | Payer: BC Managed Care – PPO

## 2022-01-22 ENCOUNTER — Encounter: Payer: Self-pay | Admitting: Emergency Medicine

## 2022-01-22 ENCOUNTER — Emergency Department
Admission: EM | Admit: 2022-01-22 | Discharge: 2022-01-22 | Disposition: A | Discharge status: Home / Self Care | Payer: BC Managed Care – PPO | Attending: Emergency Medicine | Admitting: Emergency Medicine

## 2022-01-22 ENCOUNTER — Other Ambulatory Visit: Payer: Self-pay

## 2022-01-22 DIAGNOSIS — K5792 Diverticulitis of intestine, part unspecified, without perforation or abscess without bleeding: Secondary | ICD-10-CM | POA: Insufficient documentation

## 2022-01-22 DIAGNOSIS — R1031 Right lower quadrant pain: Secondary | ICD-10-CM

## 2022-01-22 DIAGNOSIS — R109 Unspecified abdominal pain: Secondary | ICD-10-CM | POA: Diagnosis present

## 2022-01-22 DIAGNOSIS — D72829 Elevated white blood cell count, unspecified: Secondary | ICD-10-CM | POA: Diagnosis not present

## 2022-01-22 LAB — COMPREHENSIVE METABOLIC PANEL
ALT: 15 U/L (ref 0–44)
AST: 13 U/L — ABNORMAL LOW (ref 15–41)
Albumin: 3.8 g/dL (ref 3.5–5.0)
Alkaline Phosphatase: 84 U/L (ref 38–126)
Anion gap: 8 (ref 5–15)
BUN: 7 mg/dL (ref 6–20)
CO2: 24 mmol/L (ref 22–32)
Calcium: 9 mg/dL (ref 8.9–10.3)
Chloride: 105 mmol/L (ref 98–111)
Creatinine, Ser: 0.51 mg/dL (ref 0.44–1.00)
GFR, Estimated: >60 mL/min (ref >60)
Glucose, Bld: 102 mg/dL — ABNORMAL HIGH (ref 70–99)
Potassium: 3.9 mmol/L (ref 3.5–5.1)
Sodium: 137 mmol/L (ref 135–145)
Total Bilirubin: 0.9 mg/dL (ref 0.3–1.2)
Total Protein: 7.8 g/dL (ref 6.5–8.1)

## 2022-01-22 LAB — CBC
HCT: 37.3 % (ref 36.0–46.0)
Hemoglobin: 11.5 g/dL — ABNORMAL LOW (ref 12.0–15.0)
MCH: 27.4 pg (ref 26.0–34.0)
MCHC: 30.8 g/dL (ref 30.0–36.0)
MCV: 88.8 fL (ref 80.0–100.0)
Platelets: 257 10*3/uL (ref 150–400)
RBC: 4.2 MIL/uL (ref 3.87–5.11)
RDW: 13.8 % (ref 11.5–15.5)
WBC: 11.4 10*3/uL — ABNORMAL HIGH (ref 4.0–10.5)
nRBC: 0 % (ref 0.0–0.2)

## 2022-01-22 LAB — LIPASE, BLOOD: Lipase: 23 U/L (ref 11–51)

## 2022-01-22 LAB — URINALYSIS, ROUTINE W REFLEX MICROSCOPIC
Bilirubin Urine: NEGATIVE
Glucose, UA: NEGATIVE mg/dL
Ketones, ur: 5 mg/dL — AB
Leukocytes,Ua: NEGATIVE
Nitrite: NEGATIVE
Protein, ur: NEGATIVE mg/dL
Specific Gravity, Urine: 1.01 (ref 1.005–1.030)
pH: 7 (ref 5.0–8.0)

## 2022-01-22 LAB — POC URINE PREG, ED: Preg Test, Ur: NEGATIVE

## 2022-01-22 MED ORDER — ONDANSETRON HCL 4 MG/2ML IJ SOLN
4.0000 mg | Freq: Once | INTRAMUSCULAR | Status: AC
  Administered 2022-01-22: 4 mg via INTRAVENOUS
  Filled 2022-01-22: qty 2

## 2022-01-22 MED ORDER — CIPROFLOXACIN HCL 500 MG PO TABS: 500.0000 mg | ORAL_TABLET | Freq: Two times a day (BID) | ORAL | 0 refills | Status: AC

## 2022-01-22 MED ORDER — METRONIDAZOLE 500 MG/100ML IV SOLN
500.0000 mg | Freq: Once | INTRAVENOUS | Status: AC
  Administered 2022-01-22: 500 mg via INTRAVENOUS
  Filled 2022-01-22: qty 100

## 2022-01-22 MED ORDER — SODIUM CHLORIDE 0.9 % IV SOLN
2.0000 g | Freq: Once | INTRAVENOUS | Status: AC
  Administered 2022-01-22: 2 g via INTRAVENOUS
  Filled 2022-01-22: qty 20

## 2022-01-22 MED ORDER — FENTANYL CITRATE PF 50 MCG/ML IJ SOSY
50.0000 ug | PREFILLED_SYRINGE | Freq: Once | INTRAMUSCULAR | Status: AC
  Administered 2022-01-22: 50 ug via INTRAVENOUS
  Filled 2022-01-22: qty 1

## 2022-01-22 MED ORDER — IOHEXOL 300 MG/ML  SOLN
100.0000 mL | Freq: Once | INTRAMUSCULAR | Status: AC | PRN
  Administered 2022-01-22: 100 mL via INTRAVENOUS

## 2022-01-22 MED ORDER — HYDROCODONE-ACETAMINOPHEN 5-325 MG PO TABS: 1.0000 | ORAL_TABLET | Freq: Four times a day (QID) | ORAL | 0 refills | Status: AC | PRN

## 2022-01-22 MED ORDER — METRONIDAZOLE 500 MG PO TABS: 500.0000 mg | ORAL_TABLET | Freq: Four times a day (QID) | ORAL | 0 refills | Status: AC

## 2022-01-22 NOTE — ED Provider Notes (Signed)
[facility]    CSN: [id number] Arrival date & time: 01/22/22  0851      History   Chief Complaint Chief Complaint  Patient presents with   Abdominal Pain    HPI Diana Mahoney is a 36 y.o. female presenting with abd pain x2 days. History morbid obesity, c-section. Still has her appendix and gallbladder.  States she has had lower abdominal pressure worse in the right lower quadrant for the last 2 days.  This is associated with constipation and decreased appetite.  She states she has the urge to defecate, but her stool is loose and full of mucus.  She denies a history of GI issues like this in the past.  Denies nausea, vomiting, urinary symptoms, vaginal symptoms.  HPI  Past Medical History:  Diagnosis Date   GERD (gastroesophageal reflux disease)    with pregnancy   Morbid obesity ([place]) 12/09/2017    Patient Active Problem List   Diagnosis Date Noted   Morbid obesity ([place]) 12/09/2017   Previous cesarean section 11/01/2017   BMI 45.0-49.9, adult ([place]) 11/01/2017    Past Surgical History:  Procedure Laterality Date   CESAREAN SECTION     CESAREAN SECTION WITH BILATERAL TUBAL LIGATION  01/10/2018   Procedure: REPEAT CESAREAN SECTION WITH BILATERAL TUBAL LIGATION;  Surgeon: Rubie Maid, MD;  Location: [facility] ORS;  Service: Obstetrics;;  BIRTH [birth year] WEIGHT 8 lb 7 oz APGAR 8 / 8    OB History     Gravida  2   Para  2   Term  2   Preterm      AB      Living  2      SAB      IAB      Ectopic      Multiple  0   Live Births  2            Home Medications    Prior to Admission medications   Not on File    Family History Family History  Problem Relation Age of Onset   Diabetes Mother    Hypertension Mother    Diabetes Father    Breast cancer Neg Hx    Ovarian cancer Neg Hx    Colon cancer Neg Hx     Social History Social History   Tobacco Use   Smoking status: Never   Smokeless tobacco: Never  Vaping Use   Vaping Use:  Never used  Substance Use Topics   Alcohol use: No   Drug use: No     Allergies   Patient has no known allergies.   Review of Systems Review of Systems  Gastrointestinal:  Positive for abdominal pain.  All other systems reviewed and are negative.    Physical Exam Triage Vital Signs ED Triage Vitals  Enc Vitals Group     BP 01/22/22 0902 117/77     Pulse Rate 01/22/22 0902 91     Resp 01/22/22 0902 17     Temp 01/22/22 0902 98.3 F (36.8 C)     Temp Source 01/22/22 0902 Oral     SpO2 01/22/22 0902 98 %     Weight 01/22/22 0905 300 lb (136.1 kg)     Height 01/22/22 0905 5\' 4"  (1.626 m)     Head Circumference --      Peak Flow --      Pain Score 01/22/22 0904 6     Pain Loc --  Pain Edu? --      Excl. in GC? --    No data found.  Updated Vital Signs BP 117/77 (BP Location: Left Wrist)   Pulse 91   Temp 98.3 F (36.8 C) (Oral)   Resp 17   Ht 5\' 4"  (1.626 m)   Wt 300 lb (136.1 kg)   LMP 01/09/2022 (Exact Date)   SpO2 98%   BMI 51.49 kg/m   Visual Acuity Right Eye Distance:   Left Eye Distance:   Bilateral Distance:    Right Eye Near:   Left Eye Near:    Bilateral Near:     Physical Exam Vitals reviewed.  Constitutional:      General: She is not in acute distress.    Appearance: Normal appearance. She is not ill-appearing.  HENT:     Head: Normocephalic and atraumatic.     Mouth/Throat:     Mouth: Mucous membranes are moist.     Comments: Moist mucous membranes Eyes:     Extraocular Movements: Extraocular movements intact.     Pupils: Pupils are equal, round, and reactive to light.  Cardiovascular:     Rate and Rhythm: Normal rate and regular rhythm.     Heart sounds: Normal heart sounds.  Pulmonary:     Effort: Pulmonary effort is normal.     Breath sounds: Normal breath sounds. No wheezing, rhonchi or rales.  Abdominal:     General: Bowel sounds are normal. There is no distension.     Palpations: Abdomen is soft. There is no mass.      Tenderness: There is no abdominal tenderness. There is no right CVA tenderness, left CVA tenderness, guarding or rebound.     Comments: Exam limited due to body habitus RLQ pain > RUQ pain. Positive McBurney sign, negative Murphy sign. No guarding or rebound.   Skin:    General: Skin is warm.     Capillary Refill: Capillary refill takes less than 2 seconds.     Comments: Good skin turgor  Neurological:     General: No focal deficit present.     Mental Status: She is alert and oriented to person, place, and time.  Psychiatric:        Mood and Affect: Mood normal.        Behavior: Behavior normal.      UC Treatments / Results  Labs (all labs ordered are listed, but only abnormal results are displayed) Labs Reviewed - No data to display  EKG   Radiology No results found.  Procedures Procedures (including critical care time)  Medications Ordered in UC Medications - No data to display  Initial Impression / Assessment and Plan / UC Course  I have reviewed the triage vital signs and the nursing notes.  Pertinent labs & imaging results that were available during my care of the patient were reviewed by me and considered in my medical decision making (see chart for details).     This patient is a very pleasant 36 y.o. year old female presenting with RLQ pain. Afebrile, nontachy. Still has her appendix. Sent to ED for further evaluation as we do not have CT, she is in agreement.   Final Clinical Impressions(s) / UC Diagnoses   Final diagnoses:  RLQ abdominal pain     Discharge Instructions      Declines AVS Sent to ED via POV    ED Prescriptions   None    PDMP not reviewed this encounter.   20  E, PA-C 01/22/22 1594

## 2022-01-22 NOTE — Discharge Instructions (Signed)
Please call and schedule an appointment with the gastroenterologist. Take the antibiotics as prescribed and until finished. Return to the ER for symptoms that change or worsen if unable to schedule an appointment.

## 2022-01-22 NOTE — Discharge Instructions (Signed)
Declines AVS Sent to ED via POV

## 2022-01-22 NOTE — ED Triage Notes (Signed)
Pt comes pov with lower abd cramping since Tuesday. Pt states she does not have any urinary symptoms but does have some middle lower back pain. Had tubes tied-LMP 6/9.

## 2022-01-22 NOTE — ED Triage Notes (Signed)
Patient comes in c/o abd pain that started 2 days ago.   Described that she has a pressure in her lower abd. No appetite, but denies nausea or vomiting. Had low grade fever of 100 last night, treated with tylenol.   Patient reports that she had the sensation like she needed to have a BM yesterday but was unable to. She did pass some mucous. This morning she has a BM that was not formed and was a type 6 on the bristol stool chart.   Denies history of GI issues.

## 2022-03-13 ENCOUNTER — Ambulatory Visit
Admission: EM | Admit: 2022-03-13 | Discharge: 2022-03-13 | Disposition: A | Discharge status: Home / Self Care | Payer: BC Managed Care – PPO | Attending: Emergency Medicine | Admitting: Emergency Medicine

## 2022-03-13 DIAGNOSIS — S161XXA Strain of muscle, fascia and tendon at neck level, initial encounter: Secondary | ICD-10-CM

## 2022-03-13 DIAGNOSIS — S39012A Strain of muscle, fascia and tendon of lower back, initial encounter: Secondary | ICD-10-CM

## 2022-03-13 MED ORDER — IBUPROFEN 600 MG PO TABS: 600.0000 mg | ORAL_TABLET | Freq: Four times a day (QID) | ORAL | 0 refills | Status: DC | PRN

## 2022-03-13 MED ORDER — BACLOFEN 10 MG PO TABS: 10.0000 mg | ORAL_TABLET | Freq: Three times a day (TID) | ORAL | 0 refills | Status: DC
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
## 2022-03-13 NOTE — Discharge Instructions (Signed)
Take the ibuprofen, 600 mg every 6 hours with food, on a schedule for the next 48 hours and then as needed.  Take the baclofen, 10 mg every 8 hours, on a schedule for the next 48 hours and then as needed.  Apply moist heat to your neck/back for 30 minutes at a time 2-3 times a day to improve blood flow to the area and help remove the lactic acid causing the spasm.  Follow the neck/back exercises given at discharge.  Return for reevaluation for any new or worsening symptoms.  

## 2022-05-27 ENCOUNTER — Encounter: Payer: BC Managed Care – PPO | Admitting: Obstetrics and Gynecology

## 2022-08-24 ENCOUNTER — Encounter
Admission: RE | Disposition: A | Discharge status: Home / Self Care | Payer: Self-pay | Source: Home / Self Care | Attending: Gastroenterology

## 2022-08-24 ENCOUNTER — Encounter: Payer: Self-pay | Admitting: *Deleted

## 2022-08-24 ENCOUNTER — Ambulatory Visit: Payer: BC Managed Care – PPO | Admitting: Anesthesiology

## 2022-08-24 ENCOUNTER — Ambulatory Visit
Admission: RE | Admit: 2022-08-24 | Discharge: 2022-08-24 | Disposition: A | Discharge status: Home / Self Care | Payer: BC Managed Care – PPO | Attending: Gastroenterology | Admitting: Gastroenterology

## 2022-08-24 DIAGNOSIS — K640 First degree hemorrhoids: Secondary | ICD-10-CM | POA: Insufficient documentation

## 2022-08-24 DIAGNOSIS — K5730 Diverticulosis of large intestine without perforation or abscess without bleeding: Secondary | ICD-10-CM | POA: Diagnosis not present

## 2022-08-24 DIAGNOSIS — K219 Gastro-esophageal reflux disease without esophagitis: Secondary | ICD-10-CM | POA: Diagnosis not present

## 2022-08-24 DIAGNOSIS — R1030 Lower abdominal pain, unspecified: Secondary | ICD-10-CM | POA: Diagnosis not present

## 2022-08-24 DIAGNOSIS — Z6841 Body Mass Index (BMI) 40.0 and over, adult: Secondary | ICD-10-CM | POA: Insufficient documentation

## 2022-08-24 DIAGNOSIS — Z79899 Other long term (current) drug therapy: Secondary | ICD-10-CM | POA: Diagnosis not present

## 2022-08-24 HISTORY — PX: COLONOSCOPY WITH PROPOFOL: SHX5780

## 2022-08-24 LAB — POCT PREGNANCY, URINE: Preg Test, Ur: NEGATIVE

## 2022-08-24 SURGERY — COLONOSCOPY WITH PROPOFOL
Anesthesia: General

## 2022-08-24 MED ORDER — SODIUM CHLORIDE 0.9 % IV SOLN: INTRAVENOUS | Status: DC

## 2022-08-24 MED ORDER — PROPOFOL 1000 MG/100ML IV EMUL
INTRAVENOUS | Status: AC
  Filled 2022-08-24: qty 100

## 2022-08-24 MED ORDER — LIDOCAINE HCL (PF) 2 % IJ SOLN
INTRAMUSCULAR | Status: AC
  Filled 2022-08-24: qty 5

## 2022-08-24 MED ORDER — PROPOFOL 10 MG/ML IV BOLUS
INTRAVENOUS | Status: DC | PRN
  Administered 2022-08-24 (×2): 50 mg via INTRAVENOUS
  Administered 2022-08-24: 100 mg via INTRAVENOUS

## 2022-08-24 MED ORDER — LIDOCAINE HCL (CARDIAC) PF 100 MG/5ML IV SOSY
PREFILLED_SYRINGE | INTRAVENOUS | Status: DC | PRN
  Administered 2022-08-24: 20 mg via INTRAVENOUS

## 2022-08-24 MED ORDER — PROPOFOL 500 MG/50ML IV EMUL
INTRAVENOUS | Status: DC | PRN
  Administered 2022-08-24: 125 ug/kg/min via INTRAVENOUS

## 2022-08-24 NOTE — Op Note (Signed)
[facility] Patient Name: Diana Mahoney Procedure Date: 08/24/2022 9:18 AM MRN: [id number] Account #: [id number] Date of Birth: 1986/07/27 Admit Type: Outpatient Age: 37 Room: [facility] ENDO ROOM 3 Gender: Female Note Status: Finalized Instrument Name: Park Meo [id number] Procedure:             Colonoscopy Indications:           Lower abdominal pain Providers:             Andrey Farmer MD, MD Medicines:             Monitored Anesthesia Care Complications:         No immediate complications. Procedure:             Pre-Anesthesia Assessment:                        - Prior to the procedure, a History and Physical was                         performed, and patient medications and allergies were                         reviewed. The patient is competent. The risks and                         benefits of the procedure and the sedation options and                         risks were discussed with the patient. All questions                         were answered and informed consent was obtained.                         Patient identification and proposed procedure were                         verified by the physician, the nurse, the                         anesthesiologist, the anesthetist and the technician                         in the endoscopy suite. Mental Status Examination:                         alert and oriented. Airway Examination: normal                         oropharyngeal airway and neck mobility. Respiratory                         Examination: clear to auscultation. CV Examination:                         normal. Prophylactic Antibiotics: The patient does not                         require prophylactic antibiotics. Prior  Anticoagulants: The patient has taken no anticoagulant                         or antiplatelet agents. ASA Grade Assessment: II - A                         patient with mild systemic disease.  After reviewing                         the risks and benefits, the patient was deemed in                         satisfactory condition to undergo the procedure. The                         anesthesia plan was to use monitored anesthesia care                         (MAC). Immediately prior to administration of                         medications, the patient was re-assessed for adequacy                         to receive sedatives. The heart rate, respiratory                         rate, oxygen saturations, blood pressure, adequacy of                         pulmonary ventilation, and response to care were                         monitored throughout the procedure. The physical                         status of the patient was re-assessed after the                         procedure.                        After obtaining informed consent, the colonoscope was                         passed under direct vision. Throughout the procedure,                         the patient's blood pressure, pulse, and oxygen                         saturations were monitored continuously. The                         Colonoscope was introduced through the anus and                         advanced to the the terminal ileum. The colonoscopy  was performed without difficulty. The patient                         tolerated the procedure well. The quality of the bowel                         preparation was good. The terminal ileum, ileocecal                         valve, appendiceal orifice, and rectum were                         photographed. Findings:      The perianal and digital rectal examinations were normal.      The terminal ileum appeared normal.      Many small-mouthed diverticula were found in the sigmoid colon and       descending colon.      Internal hemorrhoids were found during retroflexion. The hemorrhoids       were Grade I (internal hemorrhoids that do not  prolapse).      The exam was otherwise without abnormality on direct and retroflexion       views. Impression:            - The examined portion of the ileum was normal.                        - Diverticulosis in the sigmoid colon and in the                         descending colon.                        - Internal hemorrhoids.                        - The examination was otherwise normal on direct and                         retroflexion views.                        - No specimens collected. Recommendation:        - Discharge patient to home.                        - Resume previous diet.                        - Continue present medications.                        - Repeat colonoscopy in 10 years for screening                         purposes.                        - Return to referring physician as previously                         scheduled. Procedure Code(s):     --- Professional ---  45378, Colonoscopy, flexible; diagnostic, including                         collection of specimen(s) by brushing or washing, when                         performed (separate procedure) Diagnosis Code(s):     --- Professional ---                        K64.0, First degree hemorrhoids                        R10.30, Lower abdominal pain, unspecified                        K57.30, Diverticulosis of large intestine without                         perforation or abscess without bleeding CPT copyright 2022 American Medical Association. All rights reserved. The codes documented in this report are preliminary and upon coder review may  be revised to meet current compliance requirements. Andrey Farmer MD, MD 08/24/2022 9:51:15 AM Number of Addenda: 0 Note Initiated On: 08/24/2022 9:18 AM Scope Withdrawal Time: 0 hours 9 minutes 59 seconds  Total Procedure Duration: 0 hours 12 minutes 44 seconds  Estimated Blood Loss:  Estimated blood loss: none.      [facility]

## 2022-08-24 NOTE — Anesthesia Preprocedure Evaluation (Signed)
Anesthesia Evaluation  Patient identified by MRN, date of birth, ID band Patient awake    Reviewed: Allergy & Precautions, NPO status , Patient's Chart, lab work & pertinent test results  Airway Mallampati: II  TM Distance: >3 FB Neck ROM: full    Dental  (+) Teeth Intact   Pulmonary neg pulmonary ROS   Pulmonary exam normal  + decreased breath sounds      Cardiovascular Exercise Tolerance: Good negative cardio ROS Normal cardiovascular exam Rhythm:Regular Rate:Normal     Neuro/Psych negative neurological ROS  negative psych ROS   GI/Hepatic negative GI ROS, Neg liver ROS,GERD  Medicated,,  Endo/Other  negative endocrine ROS  Morbid obesity  Renal/GU negative Renal ROS  negative genitourinary   Musculoskeletal negative musculoskeletal ROS (+)    Abdominal  (+) + obese  Peds negative pediatric ROS (+)  Hematology negative hematology ROS (+)   Anesthesia Other Findings Past Medical History: No date: GERD (gastroesophageal reflux disease)     Comment:  with pregnancy 12/09/2017: Morbid obesity ([place])  Past Surgical History: No date: CESAREAN SECTION 01/10/2018: CESAREAN SECTION WITH BILATERAL TUBAL LIGATION     Comment:  Procedure: REPEAT CESAREAN SECTION WITH BILATERAL TUBAL               LIGATION;  Surgeon: Rubie Maid, MD;  Location: [facility]               ORS;  Service: Obstetrics;;  BIRTH [birth year] WEIGHT 8 lb 7               oz APGAR 8 / 8  BMI    Body Mass Index: 47.33 kg/m      Reproductive/Obstetrics negative OB ROS                             Anesthesia Physical Anesthesia Plan  ASA: 2  Anesthesia Plan: General   Post-op Pain Management:    Induction: Intravenous  PONV Risk Score and Plan: Propofol infusion and TIVA  Airway Management Planned: Natural Airway  Additional Equipment:   Intra-op Plan:   Post-operative Plan:   Informed Consent: I have reviewed the  patients History and Physical, chart, labs and discussed the procedure including the risks, benefits and alternatives for the proposed anesthesia with the patient or authorized representative who has indicated his/her understanding and acceptance.     Dental Advisory Given  Plan Discussed with: CRNA and Surgeon  Anesthesia Plan Comments:        Anesthesia Quick Evaluation

## 2022-08-24 NOTE — Interval H&P Note (Signed)
History and Physical Interval Note:  08/24/2022 9:24 AM  Diana Mahoney  has presented today for surgery, with the diagnosis of ABDOMEN PAIN,BOWEL HABIT CHANGES,ABNORMA CT,.  The various methods of treatment have been discussed with the patient and family. After consideration of risks, benefits and other options for treatment, the patient has consented to  Procedure(s): COLONOSCOPY WITH PROPOFOL (N/A) as a surgical intervention.  The patient's history has been reviewed, patient examined, no change in status, stable for surgery.  I have reviewed the patient's chart and labs.  Questions were answered to the patient's satisfaction.     Lesly Rubenstein  Ok to proceed with colonoscopy

## 2022-08-24 NOTE — Transfer of Care (Signed)
Immediate Anesthesia Transfer of Care Note  Patient: Diana Mahoney  Procedure(s) Performed: COLONOSCOPY WITH PROPOFOL  Patient Location: PACU  Anesthesia Type:General  Level of Consciousness: awake, alert , and oriented  Airway & Oxygen Therapy: Patient Spontanous Breathing  Post-op Assessment: Report given to RN and Post -op Vital signs reviewed and stable  Post vital signs: Reviewed and stable  Last Vitals:  Vitals Value Taken Time  BP 115/60 08/24/22 0952  Temp 97   Pulse 93 08/24/22 0952  Resp 18 08/24/22 0952  SpO2 100 % 08/24/22 0952  Vitals shown include unvalidated device data.  Last Pain:  Vitals:   08/24/22 0901  TempSrc: Temporal         Complications: No notable events documented.

## 2022-08-24 NOTE — H&P (Signed)
Outpatient short stay form Pre-procedure 08/24/2022  Diana Rubenstein, MD  Primary Physician: [facility]  Reason for visit:  History of diverticulitis, lower abdominal pain  History of present illness:    37 y/o lady with history of morbid obesity here for colonoscopy for bilateral lower abdominal pain. No blood thinners. No family history of GI malignancies. History of c-sections. Had diverticulitis last year which started her intermittent lower abdominal symptoms.    Current Facility-Administered Medications:    0.9 %  sodium chloride infusion, , Intravenous, Continuous, Jarron Curley, Hilton Cork, MD  Medications Prior to Admission  Medication Sig Dispense Refill Last Dose   ibuprofen (ADVIL) 600 MG tablet Take 1 tablet (600 mg total) by mouth every 6 (six) hours as needed. 30 tablet 0 Past Month   baclofen (LIORESAL) 10 MG tablet Take 1 tablet (10 mg total) by mouth 3 (three) times daily. 30 each 0      No Known Allergies   Past Medical History:  Diagnosis Date   GERD (gastroesophageal reflux disease)    with pregnancy   Morbid obesity ([place]) 12/09/2017    Review of systems:  Otherwise negative.    Physical Exam  Gen: Alert, oriented. Appears stated age.  HEENT: PERRLA. Lungs: No respiratory distress CV: RRR Abd: soft, benign, no masses Ext: No edema   Planned procedures: Proceed with colonoscopy. The patient understands the nature of the planned procedure, indications, risks, alternatives and potential complications including but not limited to bleeding, infection, perforation, damage to internal organs and possible oversedation/side effects from anesthesia. The patient agrees and gives consent to proceed.  Please refer to procedure notes for findings, recommendations and patient disposition/instructions.     Diana Rubenstein, MD [facility]

## 2022-08-24 NOTE — Anesthesia Postprocedure Evaluation (Signed)
Anesthesia Post Note  Patient: Diana Mahoney  Procedure(s) Performed: COLONOSCOPY WITH PROPOFOL  Patient location during evaluation: PACU Anesthesia Type: General Level of consciousness: awake and awake and alert Pain management: pain level controlled Vital Signs Assessment: post-procedure vital signs reviewed and stable Respiratory status: spontaneous breathing and respiratory function stable Cardiovascular status: stable Anesthetic complications: no  No notable events documented.   Last Vitals:  Vitals:   08/24/22 1001 08/24/22 1019  BP: 128/78 121/69  Pulse: 80 62  Resp: 17 16  Temp:    SpO2: 100% 100%    Last Pain:  Vitals:   08/24/22 0951  TempSrc: Temporal                 VAN STAVEREN,Greg Eckrich

## 2022-08-25 ENCOUNTER — Encounter: Payer: Self-pay | Admitting: Gastroenterology

## 2022-09-04 IMAGING — CT CT ABD-PELV W/ CM
2 of 4 series · 16 of 46 positions shown, 18 images · IV contrast (APPLIED)
Comparison: None Available.

CLINICAL DATA: Acute lower abdominal pain.

EXAM:
CT ABDOMEN AND PELVIS WITH CONTRAST
TECHNIQUE: Multidetector CT imaging of the abdomen and pelvis was performed
using the standard protocol following bolus administration of
intravenous contrast.

[Series 2: abdomen 5.0 · axial · 0.98mm/px · z∈[+1016,+1491]mm · 13 of 107 slices shown, 15 images]
[im 6/107  soft-tissue]
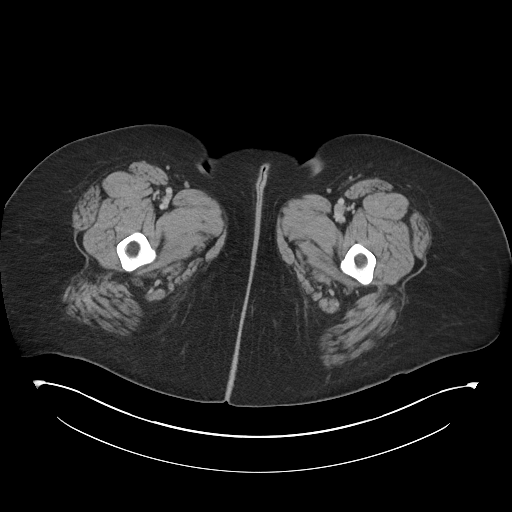
[im 6/107  bone]
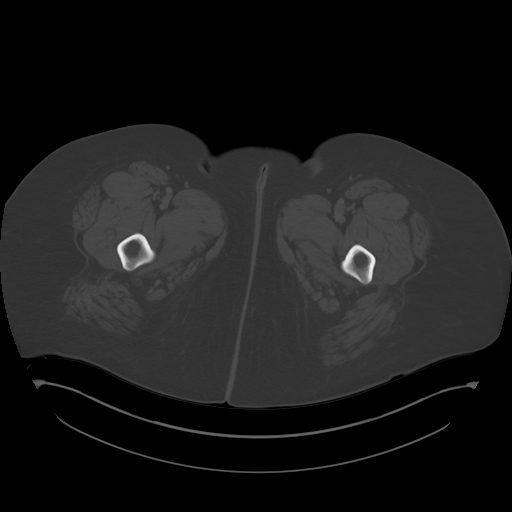
[im 12/107  soft-tissue]
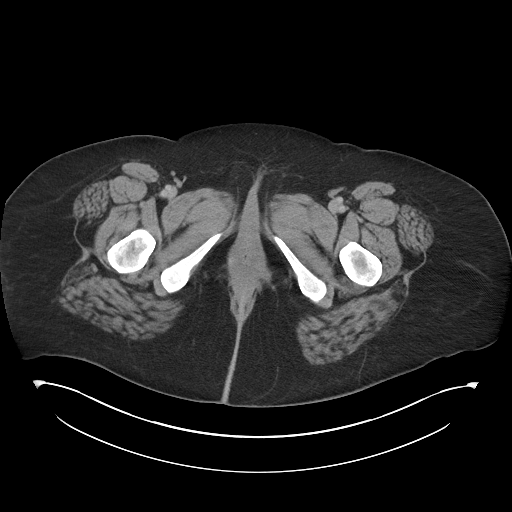
[im 24/107  soft-tissue]
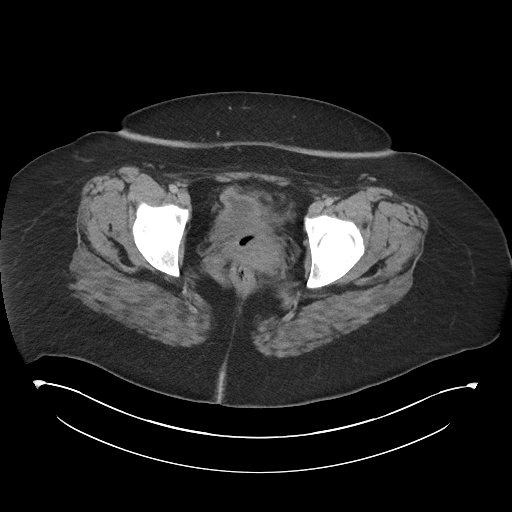
[im 30/107  soft-tissue]
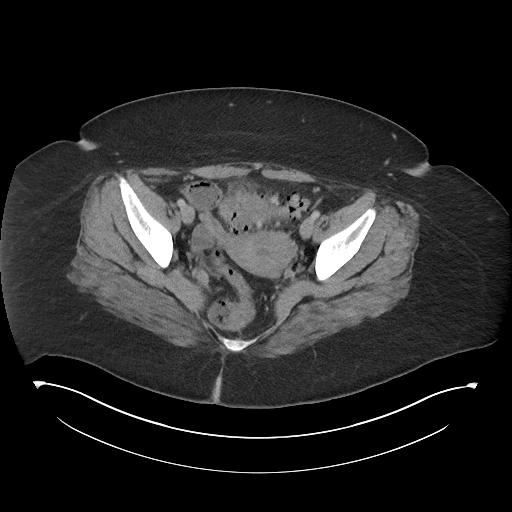
[im 36/107  soft-tissue]
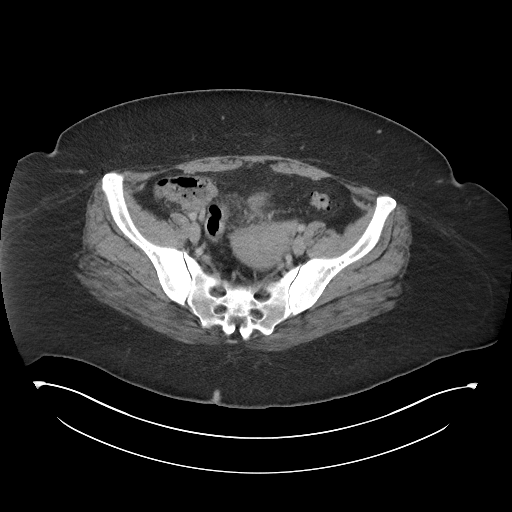
[im 48/107  soft-tissue]
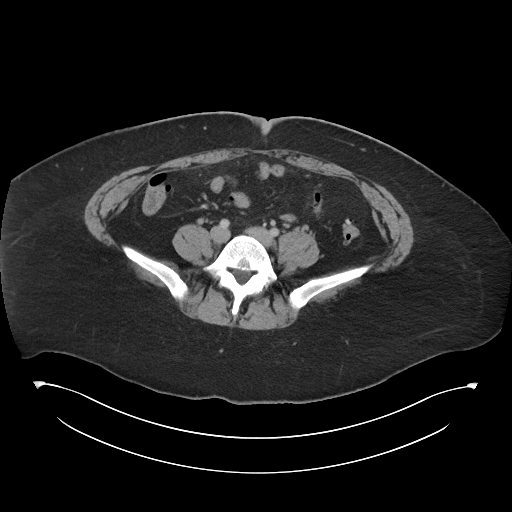
[im 54/107  soft-tissue]
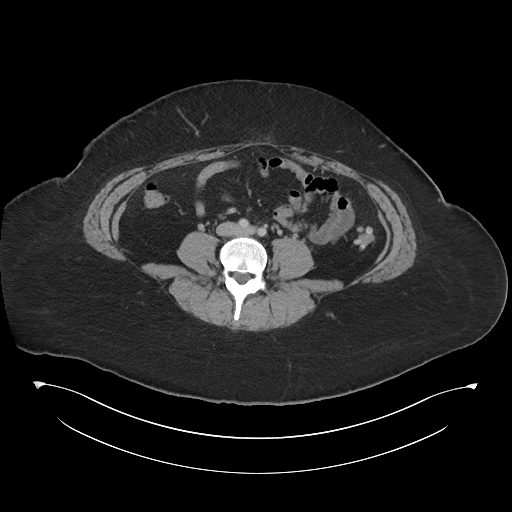
[im 59/107  soft-tissue]
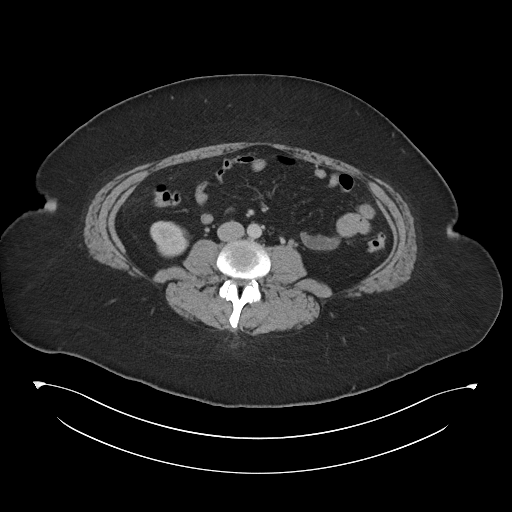
[im 71/107  soft-tissue]
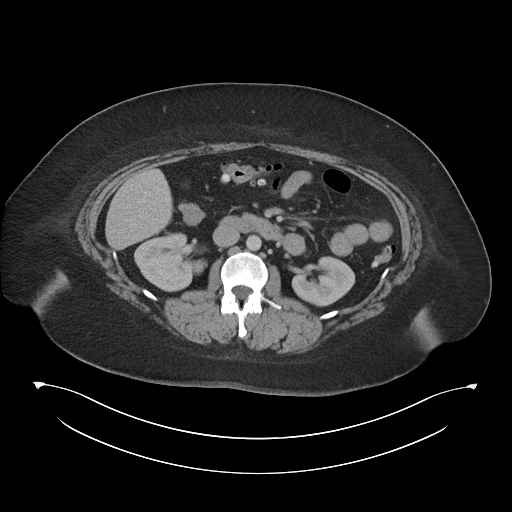
[im 71/107  bone]
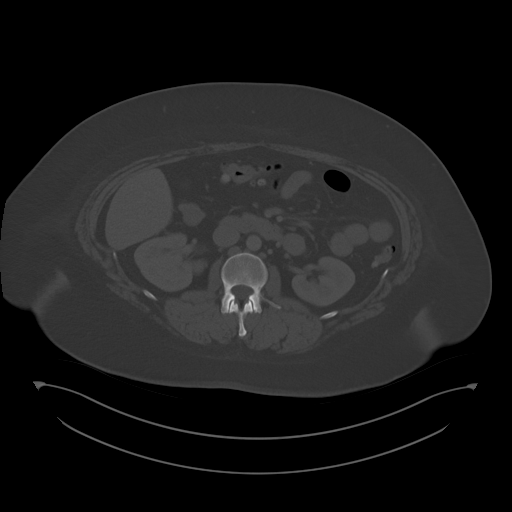
[im 77/107  soft-tissue]
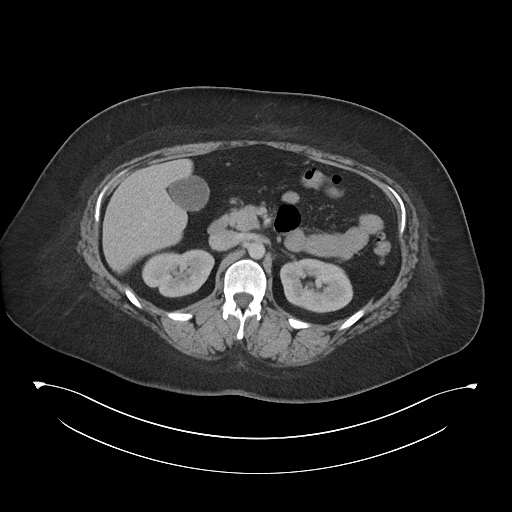
[im 83/107  soft-tissue]
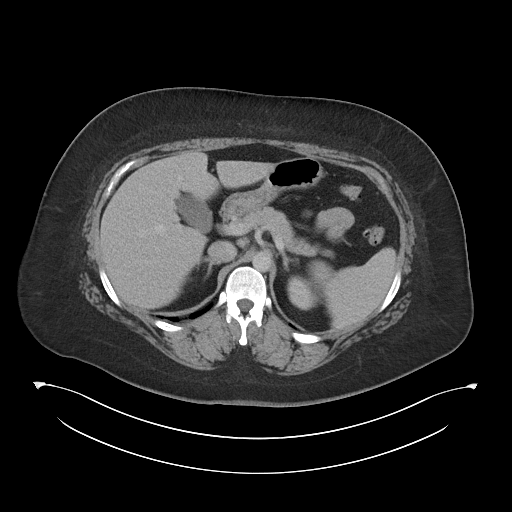
[im 95/107  soft-tissue]
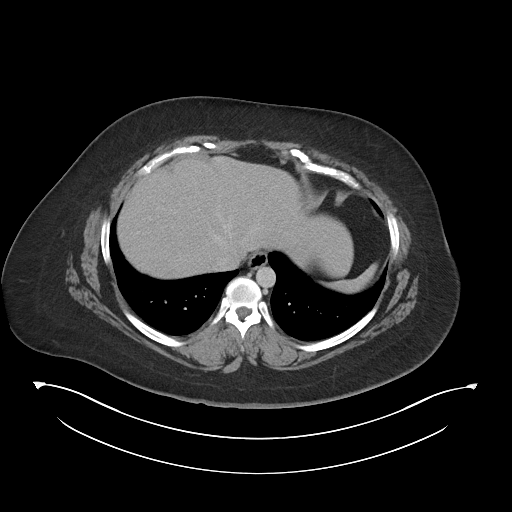
[im 101/107  soft-tissue]
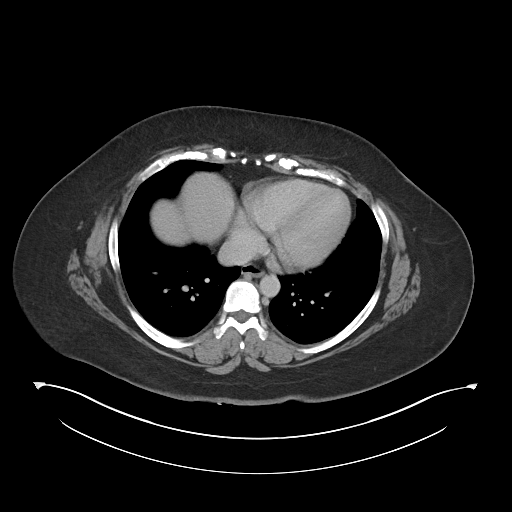

[Series 5: abdomen 3.0 mpr cor · coronal · 0.98mm/px · 3 of 110 slices shown]
[im 37/110  soft-tissue]
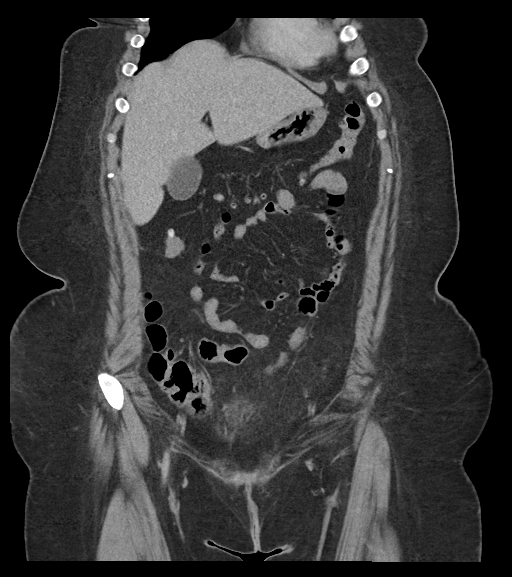
[im 49/110  soft-tissue]
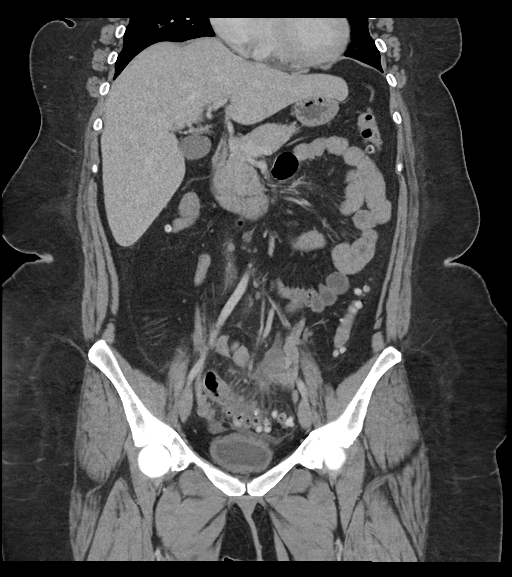
[im 61/110  soft-tissue]
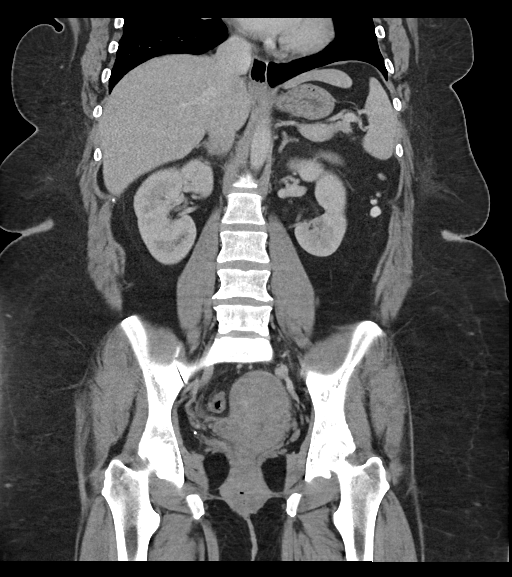

[16 of 46 positions shown; findings below may reference images not displayed]

RADIATION DOSE REDUCTION: This exam was performed according to the
departmental dose-optimization program which includes automated
exposure control, adjustment of the mA and/or kV according to
patient size and/or use of iterative reconstruction technique.

CONTRAST:  100mL OMNIPAQUE IOHEXOL 300 MG/ML  SOLN
FINDINGS: Lower chest: No acute abnormality.

Hepatobiliary: No focal liver abnormality is seen. No gallstones,
gallbladder wall thickening, or biliary dilatation.

Pancreas: Unremarkable. No pancreatic ductal dilatation or
surrounding inflammatory changes.

Spleen: Normal in size without focal abnormality.

Adrenals/Urinary Tract: Adrenal glands are unremarkable. Kidneys are
normal, without renal calculi, focal lesion, or hydronephrosis.
Bladder is unremarkable.

Stomach/Bowel: Stomach appears normal. The appendix is not clearly
visualized. There is no evidence of bowel obstruction.
Diverticulosis is noted throughout the colon. Moderate to severe
proximal sigmoid diverticulitis is noted without abscess formation.

Vascular/Lymphatic: No significant vascular findings are present. No
enlarged abdominal or pelvic lymph nodes.

Reproductive: Uterus and bilateral adnexa are unremarkable.

Other: No abdominal wall hernia or abnormality. No abdominopelvic
ascites.

Musculoskeletal: No acute or significant osseous findings.
IMPRESSION: Moderate to severe proximal sigmoid diverticulitis is noted without
abscess formation.

## 2022-12-30 ENCOUNTER — Ambulatory Visit (INDEPENDENT_AMBULATORY_CARE_PROVIDER_SITE_OTHER): Payer: BC Managed Care – PPO

## 2022-12-30 ENCOUNTER — Ambulatory Visit
Admission: EM | Admit: 2022-12-30 | Discharge: 2022-12-30 | Disposition: A | Discharge status: Home / Self Care | Payer: BC Managed Care – PPO | Attending: Family Medicine | Admitting: Family Medicine

## 2022-12-30 DIAGNOSIS — M79672 Pain in left foot: Secondary | ICD-10-CM | POA: Diagnosis not present

## 2022-12-30 DIAGNOSIS — W19XXXA Unspecified fall, initial encounter: Secondary | ICD-10-CM | POA: Diagnosis not present

## 2022-12-30 DIAGNOSIS — R030 Elevated blood-pressure reading, without diagnosis of hypertension: Secondary | ICD-10-CM | POA: Diagnosis not present

## 2022-12-30 DIAGNOSIS — S8992XA Unspecified injury of left lower leg, initial encounter: Secondary | ICD-10-CM | POA: Diagnosis not present

## 2022-12-30 MED ORDER — IBUPROFEN 600 MG PO TABS
600.0000 mg | ORAL_TABLET | Freq: Once | ORAL | Status: AC
Start: 2022-12-30 — End: 2022-12-30
  Administered 2022-12-30: 600 mg via ORAL

## 2022-12-30 MED ORDER — BACLOFEN 10 MG PO TABS
10.0000 mg | ORAL_TABLET | Freq: Three times a day (TID) | ORAL | 0 refills | Status: DC
Start: 2022-12-30 — End: 2024-05-16

## 2022-12-30 MED ORDER — NAPROXEN 500 MG PO TABS: 500.0000 mg | ORAL_TABLET | Freq: Two times a day (BID) | ORAL | 0 refills | Status: DC

## 2022-12-30 NOTE — Discharge Instructions (Addendum)
Your xray did not show any broken or dislocated bones.   If medication was prescribed, stop by the pharmacy to pick up your prescriptions.  For your  pain, Take 1500 mg Tylenol twice a day, take muscle relaxer (Baclofen) 2-3 times a day, take Naprosyn twice a day,  as needed for pain.  Rest and elevate the affected painful area.  Apply cold compresses intermittently, as needed.  As pain recedes, begin normal activities slowly as tolerated.  Follow up with primary care provider or an orthopedic provider, if symptoms persist.  Watch for worsening symptoms such as an increasing weakness or loss of sensation, increasing pain and/or the loss of bladder or bowel function. Should any of these occur, go to the emergency department immediately.

## 2022-12-30 NOTE — ED Provider Notes (Signed)
[facility]    CSN: [id number] Arrival date & time: 12/30/22  4098      History   Chief Complaint Chief Complaint  Patient presents with   Ankle Injury    LT ankle    Knee Injury    LT knee    HPI  HPI Diana Mahoney is a 37 y.o. female.   Diana Mahoney presents for left foot and knee pain after a fall at home.  She gets a Neurosurgeon of water from her car. She step in the hole in the yard this morning around 7 AM. Left foot turned outward. She had immediate pain nad has some swelling with direct impact to the left knee.  Does not recall hearing any abnormal pops or sounds.  Continues to have pain with movement.     Past Medical History:  Diagnosis Date   GERD (gastroesophageal reflux disease)    with pregnancy   Morbid obesity (HCC) 12/09/2017    Patient Active Problem List   Diagnosis Date Noted   Morbid obesity (HCC) 12/09/2017   Previous cesarean section 11/01/2017   BMI 45.0-49.9, adult (HCC) 11/01/2017    Past Surgical History:  Procedure Laterality Date   CESAREAN SECTION     CESAREAN SECTION WITH BILATERAL TUBAL LIGATION  01/10/2018   Procedure: REPEAT CESAREAN SECTION WITH BILATERAL TUBAL LIGATION;  Surgeon: Hildred Laser, MD;  Location: [facility];  Service: Obstetrics;;  BIRTH [birth year] WEIGHT 8 lb 7 oz APGAR 8 / 8   COLONOSCOPY WITH PROPOFOL N/A 08/24/2022   Procedure: COLONOSCOPY WITH PROPOFOL;  Surgeon: Regis Bill, MD;  Location: [facility];  Service: Endoscopy;  Laterality: N/A;    OB History     Gravida  2   Para  2   Term  2   Preterm      AB      Living  2      SAB      IAB      Ectopic      Multiple  0   Live Births  2            Home Medications    Prior to Admission medications   Medication Sig Start Date End Date Taking? Authorizing Provider  naproxen (NAPROSYN) 500 MG tablet Take 1 tablet (500 mg total) by mouth 2 (two) times daily with a meal. 12/30/22  Yes France Noyce, DO  baclofen (LIORESAL) 10  MG tablet Take 1 tablet (10 mg total) by mouth 3 (three) times daily. 12/30/22   Katha Cabal, DO    Family History Family History  Problem Relation Age of Onset   Diabetes Mother    Hypertension Mother    Diabetes Father    Breast cancer Neg Hx    Ovarian cancer Neg Hx    Colon cancer Neg Hx     Social History Social History   Tobacco Use   Smoking status: Never   Smokeless tobacco: Never  Vaping Use   Vaping Use: Never used  Substance Use Topics   Alcohol use: No   Drug use: No     Allergies   Patient has no known allergies.   Review of Systems Review of Systems: :negative unless otherwise stated in HPI.      Physical Exam Triage Vital Signs ED Triage Vitals  Enc Vitals Group     BP 12/30/22 0927 (!) 144/100     Pulse Rate 12/30/22 0927 74     Resp --  Temp 12/30/22 0927 98.6 F (37 C)     Temp Source 12/30/22 0927 Oral     SpO2 12/30/22 0927 99 %     Weight --      Height --      Head Circumference --      Peak Flow --      Pain Score 12/30/22 0926 7     Pain Loc --      Pain Edu? --      Excl. in GC? --    No data found.  Updated Vital Signs BP (!) 154/84 (BP Location: Right Wrist)   Pulse 74   Temp 98.6 F (37 C) (Oral)   LMP 12/03/2022 (Approximate)   SpO2 99%   Visual Acuity Right Eye Distance:   Left Eye Distance:   Bilateral Distance:    Right Eye Near:   Left Eye Near:    Bilateral Near:     Physical Exam GEN: well appearing female in no acute distress  CVS: well perfused  RESP: speaking in full sentences without pause, no respiratory distress  MSK: left LE TTP noted at the lateral malleolus and lateral fibula, + visible erythema and mild swelling,. No ecchymosis, or bony deformity. No notable pes planus deformity. Transverse arch grossly intact;  No evidence of tibiotalar deviation; Range of motion is full in all directions. Strength is 5/5 in all directions. No tenderness at the insertion/body/myotendinous junction of  the Achilles tendon; No tenderness on posterior aspects of  medial malleolus; Talar dome nontender; Unremarkable calcaneal squeeze; No plantar calcaneal tenderness; No tenderness over the navicular prominence or  over cuboid; No pain at base of 5th MT; No tenderness at the distal metatarsals; Able to walk 4 steps.     UC Treatments / Results  Labs (all labs ordered are listed, but only abnormal results are displayed) Labs Reviewed - No data to display  EKG   Radiology DG Foot Complete Left  Result Date: 12/30/2022 CLINICAL DATA:  Left foot pain.  Fall. EXAM: LEFT FOOT - COMPLETE 3+ VIEW COMPARISON:  None Available. FINDINGS: There is no evidence of fracture or dislocation. There is no evidence of arthropathy or other focal bone abnormality. Soft tissues are unremarkable. IMPRESSION: Negative left foot radiographs. Electronically Signed   By: Marin Roberts M.D.   On: 12/30/2022 10:56   DG Tibia/Fibula Left  Result Date: 12/30/2022 CLINICAL DATA:  Fall.  Leg pain. EXAM: LEFT TIBIA AND FIBULA - 2 VIEW COMPARISON:  None Available. FINDINGS: No acute bone or soft tissue abnormality is present. Calcifications in the anterior soft tissues in the lower third of the lower leg may reflect sequela of prior trauma. The knee and ankle joints are located. IMPRESSION: 1. No acute abnormality. 2. Calcifications in the anterior soft tissues in the lower leg may reflect sequela of prior trauma. Electronically Signed   By: Marin Roberts M.D.   On: 12/30/2022 10:55      Procedures Procedures (including critical care time)  Medications Ordered in UC Medications  ibuprofen (ADVIL) tablet 600 mg (600 mg Oral Given 12/30/22 1010)    Initial Impression / Assessment and Plan / UC Course  I have reviewed the triage vital signs and the nursing notes.  Pertinent labs & imaging results that were available during my care of the patient were reviewed by me and considered in my medical decision making  (see chart for details).      Pt is a 37 y.o.  female with left  foot and leg pain after a fall at home this morning.  On exam, pt has tenderness at lateral shin malleolus concerning for possible high ankle fracture.   Given 600 mg ibuprofen for pain. Obtained foot and tib-fib plain films.  Personally interpreted by me were unremarkable for fracture or dislocation. Radiologist report reviewed and additionally notes anterior soft tissue calcifications likely due to prior trauma.  Patient given ankle brace prior to discharge.  Patient to gradually return to normal activities, as tolerated and continue ordinary activities within the limits permitted by pain. Prescribed Naproxen sodium  and muscle relaxer  for pain relief.  Tylenol PRN. Advised patient to avoid OTC NSAIDs while taking prescription NSAID. Counseled patient on red flag symptoms and when to seek immediate care. Patient to follow up with orthopedic provider, if symptoms do not improve with conservative treatment.    Hye is hypertensive initially 144/100 and after sitting for about 30 to 45 minutes was 154/84.  She denies history of hypertension but has been told in the past that her blood pressures have been elevated.  She does not take any medications for this.  Her elevated blood pressures are likely complicated by her acute pain.  Advised her to take her blood pressure daily and follow-up with her primary care provider in 2 weeks.  Return and ED precautions given. Understanding voiced. Discussed MDM, treatment plan and plan for follow-up with patient/parent who agrees with plan.   Final Clinical Impressions(s) / UC Diagnoses   Final diagnoses:  Fall, initial encounter  Left leg injury, initial encounter  Left foot pain  Elevated blood pressure reading     Discharge Instructions      Your xray did not show any broken or dislocated bones.   If medication was prescribed, stop by the pharmacy to pick up your prescriptions.  For  your  pain, Take 1500 mg Tylenol twice a day, take muscle relaxer (Baclofen) 2-3 times a day, take Naprosyn twice a day,  as needed for pain.  Rest and elevate the affected painful area.  Apply cold compresses intermittently, as needed.  As pain recedes, begin normal activities slowly as tolerated.  Follow up with primary care provider or an orthopedic provider, if symptoms persist.  Watch for worsening symptoms such as an increasing weakness or loss of sensation, increasing pain and/or the loss of bladder or bowel function. Should any of these occur, go to the emergency department immediately.        ED Prescriptions     Medication Sig Dispense Auth. Provider   baclofen (LIORESAL) 10 MG tablet Take 1 tablet (10 mg total) by mouth 3 (three) times daily. 30 each Mariene Dickerman, DO   naproxen (NAPROSYN) 500 MG tablet Take 1 tablet (500 mg total) by mouth 2 (two) times daily with a meal. 30 tablet Tmya Wigington, DO      PDMP not reviewed this encounter.   Katha Cabal, DO 01/04/23 [phone]

## 2022-12-30 NOTE — ED Triage Notes (Addendum)
Pt states she had a fall this morning and injured LT ankle, pt states she has not taken anything for pain. Pt states her foot got caught and when she fell she also landed on on LT knee, c/o LT knee cap pain.

## 2023-11-19 ENCOUNTER — Other Ambulatory Visit: Payer: Self-pay | Admitting: Family Medicine

## 2023-11-19 DIAGNOSIS — M5412 Radiculopathy, cervical region: Secondary | ICD-10-CM

## 2023-11-24 ENCOUNTER — Encounter: Payer: Self-pay | Admitting: Family Medicine

## 2023-11-27 ENCOUNTER — Ambulatory Visit
Admission: RE | Admit: 2023-11-27 | Discharge: 2023-11-27 | Disposition: A | Discharge status: Home / Self Care | Payer: Self-pay | Source: Ambulatory Visit | Attending: Family Medicine | Admitting: Family Medicine

## 2023-11-27 DIAGNOSIS — M5412 Radiculopathy, cervical region: Secondary | ICD-10-CM

## 2024-05-16 ENCOUNTER — Ambulatory Visit

## 2024-05-16 ENCOUNTER — Ambulatory Visit (INDEPENDENT_AMBULATORY_CARE_PROVIDER_SITE_OTHER)

## 2024-05-16 ENCOUNTER — Ambulatory Visit
Admission: EM | Admit: 2024-05-16 | Discharge: 2024-05-16 | Disposition: A | Discharge status: Home / Self Care | Attending: Family Medicine | Admitting: Family Medicine

## 2024-05-16 DIAGNOSIS — M25572 Pain in left ankle and joints of left foot: Secondary | ICD-10-CM

## 2024-05-16 DIAGNOSIS — R030 Elevated blood-pressure reading, without diagnosis of hypertension: Secondary | ICD-10-CM

## 2024-05-16 DIAGNOSIS — J22 Unspecified acute lower respiratory infection: Secondary | ICD-10-CM

## 2024-05-16 MED ORDER — IBUPROFEN 800 MG PO TABS
800.0000 mg | ORAL_TABLET | Freq: Once | ORAL | Status: AC
  Administered 2024-05-16: 800 mg via ORAL

## 2024-05-16 MED ORDER — AZITHROMYCIN 250 MG PO TABS: ORAL_TABLET | ORAL | 0 refills | Status: AC

## 2024-05-16 MED ORDER — PREDNISONE 10 MG (21) PO TBPK: ORAL_TABLET | Freq: Every day | ORAL | 0 refills | Status: AC

## 2024-05-16 NOTE — Discharge Instructions (Addendum)
 On review of your xray images, you did not have any fractures or dislocated bones. You should see your results in MyChart. Apply warm compresses every 3-4 hours to promote healing. Wear your ankle brace with activity. Take 1000 mg Tylenol  as needed for pain every 6 hours.  Follow up with an orthopedic provider, if not improving.   For your cough: take the antibiotics and steroids as prescribed

## 2024-05-16 NOTE — ED Triage Notes (Signed)
 Pt c/o L ankle pain & edema x1 day. States she took a wrong step & twisted ankle. Has tried OTC meds w/o relief.

## 2024-05-16 NOTE — ED Notes (Signed)
 Vitals were not rechecked prior to pt leaving, due to staff not seeing order.

## 2024-05-16 NOTE — ED Provider Notes (Signed)
 [facility]    CSN: [id number] Arrival date & time: 05/16/24  9180      History   Chief Complaint Chief Complaint  Patient presents with   Ankle Pain    HPI  HPI Diana Mahoney is a 38 y.o. female.   Diana Mahoney presents for left ankle pain with edema that started yesterday after twisting her ankle. Says she stepped wrong while helping her husband unload something from the trailer. There was a hole and she stepped into it and fell. She needed to use a chair  to get up. Had immediate pain and swelling.  She applied some Tylenol  cream and ibuprofen  which helped somewhat.    Productive cough for the past 3 weeks. She was COVID negative at the start of her symptoms.  No fever.  Saw a doctor who gave her some perles and codeine then told her that it would go away.  But, the cough has not gone away.  Denies history of vaping and smoking. No history of asthma.     Past Medical History:  Diagnosis Date   GERD (gastroesophageal reflux disease)    with pregnancy   Morbid obesity (HCC) 12/09/2017    Patient Active Problem List   Diagnosis Date Noted   Morbid obesity (HCC) 12/09/2017   Previous cesarean section 11/01/2017   BMI 45.0-49.9, adult (HCC) 11/01/2017    Past Surgical History:  Procedure Laterality Date   CESAREAN SECTION     CESAREAN SECTION WITH BILATERAL TUBAL LIGATION  01/10/2018   Procedure: REPEAT CESAREAN SECTION WITH BILATERAL TUBAL LIGATION;  Surgeon: Connell Davies, MD;  Location: [facility];  Service: Obstetrics;;  BIRTH [birth year] WEIGHT 8 lb 7 oz APGAR 8 / 8   COLONOSCOPY WITH PROPOFOL  N/A 08/24/2022   Procedure: COLONOSCOPY WITH PROPOFOL ;  Surgeon: Maryruth Ole DASEN, MD;  Location: [facility];  Service: Endoscopy;  Laterality: N/A;    OB History     Gravida  2   Para  2   Term  2   Preterm      AB      Living  2      SAB      IAB      Ectopic      Multiple  0   Live Births  2            Home Medications    Prior to  Admission medications   Medication Sig Start Date End Date Taking? Authorizing Provider  azithromycin  (ZITHROMAX  Z-PAK) 250 MG tablet Take 2 tablets on day 1 then 1 tablet daily 05/16/24  Yes Berea Majkowski, DO  fluticasone (FLONASE) 50 MCG/ACT nasal spray Place 2 sprays into both nostrils once daily as needed 08/23/23  Yes [provider]  predniSONE  (STERAPRED UNI-PAK 21 TAB) 10 MG (21) TBPK tablet Take by mouth daily. Take 6 tabs by mouth daily for 1, then 5 tabs for 1 day, then 4 tabs for 1 day, then 3 tabs for 1 day, then 2 tabs for 1 day, then 1 tab for 1 day. 05/16/24  Yes Kriste Berth, DO    Family History Family History  Problem Relation Age of Onset   Diabetes Mother    Hypertension Mother    Diabetes Father    Breast cancer Neg Hx    Ovarian cancer Neg Hx    Colon cancer Neg Hx     Social History Social History   Tobacco Use   Smoking status: Never   Smokeless  tobacco: Never  Vaping Use   Vaping status: Never Used  Substance Use Topics   Alcohol use: No   Drug use: No     Allergies   Patient has no known allergies.   Review of Systems Review of Systems: :negative unless otherwise stated in HPI.      Physical Exam Triage Vital Signs ED Triage Vitals  Encounter Vitals Group     BP 05/16/24 0832 (!) 165/101     Girls Systolic BP Percentile --      Girls Diastolic BP Percentile --      Boys Systolic BP Percentile --      Boys Diastolic BP Percentile --      Pulse Rate 05/16/24 0832 95     Resp 05/16/24 0832 16     Temp 05/16/24 0832 98.9 F (37.2 C)     Temp Source 05/16/24 0832 Oral     SpO2 05/16/24 0832 95 %     Weight 05/16/24 0831 299 lb (135.6 kg)     Height 05/16/24 0831 5' 4 (1.626 m)     Head Circumference --      Peak Flow --      Pain Score 05/16/24 0838 6     Pain Loc --      Pain Education --      Exclude from Growth Chart --    No data found.  Updated Vital Signs BP (!) 144/96 (BP Location: Left Arm)   Pulse 95    Temp 98.9 F (37.2 C) (Oral)   Resp 16   Ht 5' 4 (1.626 m)   Wt 135.6 kg   LMP 05/02/2024 (Approximate)   SpO2 95%   BMI 51.32 kg/m   Visual Acuity Right Eye Distance:   Left Eye Distance:   Bilateral Distance:    Right Eye Near:   Left Eye Near:    Bilateral Near:     Physical Exam GEN: well appearing female in no acute distress  CVS: well perfused, regular rate and rhythm RESP: speaking in full sentences without pause, no respiratory distress, clear bilaterally  MSK:   Ankle/Foot, Left: TTP noted at the medial and lateral malleoli, anterior talus and of 5th metatarsal shaft . No visible erythema, ecchymosis, or bony deformity. +swelling of ankle and foot. Range of motion is full in all directions. Strength is 5/5 in all directions. No tenderness at the insertion/body/myotendinous junction of the Achilles tendon; Unremarkable squeeze; Talar dome non-tender; Unremarkable calcaneal squeeze; No plantar calcaneal tenderness; No tenderness over the navicular prominence or  over cuboid; No tenderness at the distal metatarsals    UC Treatments / Results  Labs (all labs ordered are listed, but only abnormal results are displayed) Labs Reviewed - No data to display  EKG   Radiology DG Ankle Complete Left Result Date: 05/16/2024 CLINICAL DATA:  fall, pain, swelling EXAM: LEFT ANKLE COMPLETE - 3+ VIEW; LEFT FOOT - COMPLETE 3+ VIEW COMPARISON:  Dec 30, 2022 FINDINGS: Left ankle No ankle mortise widening. The talar dome is intact. No acute fracture or dislocation. Well corticated ossific fragments subjacent to the medial malleolus, likely due to remote trauma. Moderate soft tissue swelling about the ankle. Left foot No acute fracture or dislocation. There is no evidence of arthropathy or other focal bone abnormality. Soft tissue swelling about the ankle extending along the dorsum of the foot. IMPRESSION: Moderate soft tissue swelling about the ankle. Otherwise, no acute fracture or  dislocation in the left ankle and left  foot. Electronically Signed   By: Rogelia Myers M.D.   On: 05/16/2024 09:55   DG Foot Complete Left Result Date: 05/16/2024 CLINICAL DATA:  fall, pain, swelling EXAM: LEFT ANKLE COMPLETE - 3+ VIEW; LEFT FOOT - COMPLETE 3+ VIEW COMPARISON:  Dec 30, 2022 FINDINGS: Left ankle No ankle mortise widening. The talar dome is intact. No acute fracture or dislocation. Well corticated ossific fragments subjacent to the medial malleolus, likely due to remote trauma. Moderate soft tissue swelling about the ankle. Left foot No acute fracture or dislocation. There is no evidence of arthropathy or other focal bone abnormality. Soft tissue swelling about the ankle extending along the dorsum of the foot. IMPRESSION: Moderate soft tissue swelling about the ankle. Otherwise, no acute fracture or dislocation in the left ankle and left foot. Electronically Signed   By: Rogelia Myers M.D.   On: 05/16/2024 09:55      Procedures Procedures (including critical care time)  Medications Ordered in UC Medications  ibuprofen  (ADVIL ) tablet 800 mg (800 mg Oral Given 05/16/24 0908)    Initial Impression / Assessment and Plan / UC Course  I have reviewed the triage vital signs and the nursing notes.  Pertinent labs & imaging results that were available during my care of the patient were reviewed by me and considered in my medical decision making (see chart for details).      Pt is a 38 y.o.  female with 1 day of left ankle pain after twisting her ankle after stepping in a hole. BP elevated in the setting of acute pain.   Given ibuprofen  here for pain. On exam, pt has tenderness at TTP noted at the medial and lateral malleoli, anterior talus and of 5th metatarsal shaft concerning for fracture.   Obtained left foot and ankle plain films.  Personally interpreted by me were unremarkable for fracture or dislocation. Radiologist report reviewed and additionally notes left ankle soft  tissue swelling.  Given ankle brace.  Patient to gradually return to normal activities, as tolerated and continue ordinary activities within the limits permitted by pain. Motrin /Tylenol  PRN. Patient to follow up with orthopedic provider, if symptoms do not improve with conservative treatment.    Pt has had 3 weeks of cough that is not improving.  Roslynn is  afebrile here without recent antipyretics. Satting 95% well on room air. Overall pt is  non-toxic appearing, well hydrated, without respiratory distress. Pulmonary exam is clear except for cough.  After shared decision making, we will not pursue chest x-ray at this time.  COVID  and influenza testing deferred due to length of symptoms.   Treat acute lower respiratory tract infection with steroids and antibiotics as below.  Typical duration of symptoms discussed. Return and ED precautions given and patient voiced understanding.   Discussed MDM, treatment plan and plan for follow-up with patient who agrees with plan.     Final Clinical Impressions(s) / UC Diagnoses   Final diagnoses:  Lower respiratory tract infection  Pain of joint of left ankle and foot  Elevated blood pressure reading without diagnosis of hypertension     Discharge Instructions      On review of your xray images, you did not have any fractures or dislocated bones. You should see your results in MyChart. Apply warm compresses every 3-4 hours to promote healing. Wear your ankle brace with activity. Take 1000 mg Tylenol  as needed for pain every 6 hours.  Follow up with an orthopedic provider, if not improving.  For your cough: take the antibiotics and steroids as prescribed        ED Prescriptions     Medication Sig Dispense Auth. Provider   azithromycin  (ZITHROMAX  Z-PAK) 250 MG tablet Take 2 tablets on day 1 then 1 tablet daily 6 tablet Wandalene Abrams, DO   predniSONE  (STERAPRED UNI-PAK 21 TAB) 10 MG (21) TBPK tablet Take by mouth daily. Take 6 tabs by mouth  daily for 1, then 5 tabs for 1 day, then 4 tabs for 1 day, then 3 tabs for 1 day, then 2 tabs for 1 day, then 1 tab for 1 day. 21 tablet Mirinda Monte, DO      PDMP not reviewed this encounter.   Kriste Berth, DO 05/22/24 2241
